# Patient Record
Sex: Male | Born: 1970 | Race: Black or African American | Hispanic: No | Marital: Married | Smoking: Never smoker
Health system: Southern US, Community
[De-identification: ages and names within clinical notes are randomized; demographics above are authoritative.]

## PROBLEM LIST (undated history)

## (undated) DIAGNOSIS — G4730 Sleep apnea, unspecified: Secondary | ICD-10-CM

## (undated) DIAGNOSIS — Z973 Presence of spectacles and contact lenses: Secondary | ICD-10-CM

## (undated) DIAGNOSIS — H9190 Unspecified hearing loss, unspecified ear: Secondary | ICD-10-CM

## (undated) HISTORY — DX: Sleep apnea, unspecified: G47.30

## (undated) HISTORY — DX: Unspecified hearing loss, unspecified ear: H91.90

## (undated) HISTORY — PX: SKIN GRAFT: SHX250

## (undated) HISTORY — PX: FOREARM FRACTURE SURGERY: SHX649

## (undated) HISTORY — PX: APPENDECTOMY: SHX54

---

## 2013-11-24 LAB — PSA

## 2014-11-28 ENCOUNTER — Encounter: Payer: Self-pay | Admitting: Family Medicine

## 2014-11-28 ENCOUNTER — Ambulatory Visit (INDEPENDENT_AMBULATORY_CARE_PROVIDER_SITE_OTHER): Payer: Managed Care, Other (non HMO) | Admitting: Family Medicine

## 2014-11-28 VITALS — BP 130/82 | HR 76 | Temp 98.4°F | Ht 66.4 in | Wt 194.0 lb

## 2014-11-28 DIAGNOSIS — B351 Tinea unguium: Secondary | ICD-10-CM | POA: Insufficient documentation

## 2014-11-28 DIAGNOSIS — Z0000 Encounter for general adult medical examination without abnormal findings: Secondary | ICD-10-CM | POA: Diagnosis not present

## 2014-11-28 LAB — UA/M W/RFLX CULTURE, ROUTINE
BILIRUBIN UA: NEGATIVE
GLUCOSE, UA: NEGATIVE
KETONES UA: NEGATIVE
LEUKOCYTES UA: NEGATIVE
Nitrite, UA: NEGATIVE
PROTEIN UA: NEGATIVE
SPEC GRAV UA: 1.02 (ref 1.005–1.030)
Urobilinogen, Ur: 0.2 mg/dL (ref 0.2–1.0)
pH, UA: 5.5 (ref 5.0–7.5)

## 2014-11-28 LAB — MICROSCOPIC EXAMINATION
Bacteria, UA: NONE SEEN
Epithelial Cells (non renal): NONE SEEN /hpf (ref 0–10)
RBC, UA: NONE SEEN /hpf (ref 0–?)
WBC, UA: NONE SEEN /hpf (ref 0–?)

## 2014-11-28 MED ORDER — CICLOPIROX 8 % EX SOLN: CUTANEOUS | Status: DC

## 2014-11-28 NOTE — Assessment & Plan Note (Signed)
Will treat with ciprodex. Recheck 3 months to see how he is doing. Not interested in taking a pill right now.

## 2014-11-28 NOTE — Progress Notes (Signed)
BP 130/82 mmHg  Pulse 76  Temp(Src) 98.4 F (36.9 C)  Ht 5' 6.4" (1.687 m)  Wt 194 lb (87.998 kg)  BMI 30.92 kg/m2  SpO2 97%   Subjective:    Patient ID: Manuel Ruffing., male    DOB: [birth year], 44 y.o.   MRN: [id number]  HPI: Manuel Dome. is a 44 y.o. male presenting on 11/28/2014 for comprehensive medical examination. Current medical complaints include: none  He currently lives with: his spouse Interim Problems from his last visit: toe nail fungus- very bothersome. Has tried OTC medications without benefit. Would like something done for it.   Depression Screen done today and results listed below:  Depression screen [facility] 2/9 11/28/2014  Decreased Interest 0  Down, Depressed, Hopeless 0  PHQ - 2 Score 0   Past Medical History:  Past Medical History  Diagnosis Date  . Hearing loss     Surgical History:  Past Surgical History  Procedure Laterality Date  . Forearm fracture surgery      x's 2  . Skin graft      ankle    Medications:  No current outpatient prescriptions on file prior to visit.   No current facility-administered medications on file prior to visit.    Allergies:  No Known Allergies  Social History:  Social History   Social History  . Marital Status: Married    Spouse Name: N/A  . Number of Children: N/A  . Years of Education: N/A   Occupational History  . Not on file.   Social History Main Topics  . Smoking status: Never Smoker   . Smokeless tobacco: Never Used  . Alcohol Use: 0.0 - 0.6 oz/week    0-1 Cans of beer per week     Comment: Socially.  . Drug Use: No  . Sexual Activity: Yes   Other Topics Concern  . Not on file   Social History Narrative   History  Smoking status  . Never Smoker   Smokeless tobacco  . Never Used   History  Alcohol Use  . 0.0 - 0.6 oz/week  . 0-1 Cans of beer per week    Comment: Socially.    Family History:  Family History  Problem Relation Age of Onset  . Diabetes Mother   . Heart  disease Father   . Hypertension Maternal Grandmother   . Alcohol abuse Maternal Grandfather   . Dementia Paternal Grandmother   . Cancer Paternal Grandfather     Lung    Past medical history, surgical history, medications, allergies, family history and social history reviewed with patient today and changes made to appropriate areas of the chart.   Review of Systems  Constitutional: Negative.   HENT: Negative.   Eyes: Negative.   Respiratory: Negative.   Cardiovascular: Negative.   Gastrointestinal: Positive for heartburn. Negative for nausea, vomiting, abdominal pain, diarrhea, constipation, blood in stool and melena.  Genitourinary: Negative.   Musculoskeletal: Negative.   Skin: Negative.   Neurological: Negative.   Endo/Heme/Allergies: Negative.   Psychiatric/Behavioral: Negative.     All other ROS negative except what is listed above and in the HPI.      Objective:    BP 130/82 mmHg  Pulse 76  Temp(Src) 98.4 F (36.9 C)  Ht 5' 6.4" (1.687 m)  Wt 194 lb (87.998 kg)  BMI 30.92 kg/m2  SpO2 97%  Wt Readings from Last 3 Encounters:  11/28/14 194 lb (87.998 kg)  11/24/13 197 lb (89.359 kg)  Physical Exam  Constitutional: He is oriented to person, place, and time. He appears well-developed and well-nourished. No distress.  HENT:  Head: Normocephalic and atraumatic.  Right Ear: Hearing and external ear normal.  Left Ear: Hearing and external ear normal.  Nose: Nose normal.  Mouth/Throat: Oropharynx is clear and moist. No oropharyngeal exudate.  Eyes: Conjunctivae, EOM and lids are normal. Pupils are equal, round, and reactive to light. Right eye exhibits no discharge. Left eye exhibits no discharge. No scleral icterus.  Neck: Normal range of motion. Neck supple. No JVD present. No tracheal deviation present. No thyromegaly present.  Cardiovascular: Normal rate, regular rhythm, normal heart sounds and intact distal pulses.  Exam reveals no gallop and no friction rub.    No murmur heard. Pulmonary/Chest: Effort normal and breath sounds normal. No stridor. No respiratory distress. He has no wheezes. He has no rales. He exhibits no tenderness.  Abdominal: Soft. Bowel sounds are normal. He exhibits no distension and no mass. There is no tenderness. There is no rebound and no guarding. Hernia confirmed negative in the right inguinal area and confirmed negative in the left inguinal area.  Genitourinary: Testes normal and penis normal. Cremasteric reflex is present. Uncircumcised. No phimosis, hypospadias, penile erythema or penile tenderness. No discharge found.  Musculoskeletal: Normal range of motion. He exhibits no edema or tenderness.  Lymphadenopathy:    He has no cervical adenopathy.       Right: No inguinal adenopathy present.       Left: No inguinal adenopathy present.  Neurological: He is alert and oriented to person, place, and time. He has normal reflexes. He displays normal reflexes. No cranial nerve deficit. He exhibits normal muscle tone. Coordination normal.  Skin: Skin is warm, dry and intact. No rash noted. No erythema. No pallor.  Discolored great toenails bilaterally  Psychiatric: He has a normal mood and affect. His speech is normal and behavior is normal. Judgment and thought content normal. Cognition and memory are normal.  Nursing note and vitals reviewed.   Results for orders placed or performed in visit on 11/27/14  PSA  Result Value Ref Range   PSA PP       Assessment & Plan:   Problem List Items Addressed This Visit      Musculoskeletal and Integument   Toenail fungus   Relevant Medications   ciclopirox (PENLAC) 8 % solution    Other Visit Diagnoses    Routine general medical examination at a health care facility    -  Primary    Doing well. Up to date on vaccines. Screening labs checked today. Work on diet and exercise. Continue to monitor.     Relevant Orders    CBC with Differential/Platelet    Comprehensive metabolic  panel    Lipid Panel w/o Chol/HDL Ratio    TSH    UA/M w/rflx Culture, Routine        LABORATORY TESTING:  Health maintenance labs ordered today as discussed above.   IMMUNIZATIONS:   - Tdap: Tetanus vaccination status reviewed: last tetanus booster within 10 years. - Influenza: Up to date - Pneumovax: Not applicable  PATIENT COUNSELING:    Sexuality: Discussed sexually transmitted diseases, partner selection, use of condoms, avoidance of unintended pregnancy  and contraceptive alternatives.   Advised to avoid cigarette smoking.  I discussed with the patient that most people either abstain from alcohol or drink within safe limits (<=14/week and <=4 drinks/occasion for males, <=7/weeks and <= 3 drinks/occasion for females)  and that the risk for alcohol disorders and other health effects rises proportionally with the number of drinks per week and how often a drinker exceeds daily limits.  Discussed cessation/primary prevention of drug use and availability of treatment for abuse.   Diet: Encouraged to adjust caloric intake to maintain  or achieve ideal body weight, to reduce intake of dietary saturated fat and total fat, to limit sodium intake by avoiding high sodium foods and not adding table salt, and to maintain adequate dietary potassium and calcium preferably from fresh fruits, vegetables, and low-fat dairy products.    stressed the importance of regular exercise  Injury prevention: Discussed safety belts, safety helmets, smoke detector, smoking near bedding or upholstery.   Dental health: Discussed importance of regular tooth brushing, flossing, and dental visits.   Follow up plan: NEXT PREVENTATIVE PHYSICAL DUE IN 1 YEAR. Return in about 3 months (around 02/28/2015) for toenail fungus follow up.

## 2014-11-28 NOTE — Patient Instructions (Addendum)
Health Maintenance, Male A healthy lifestyle and preventative care can promote health and wellness.  Maintain regular health, dental, and eye exams.  Eat a healthy diet. Foods like vegetables, fruits, whole grains, low-fat dairy products, and lean protein foods contain the nutrients you need and are low in calories. Decrease your intake of foods high in solid fats, added sugars, and salt. Get information about a proper diet from your health care provider, if necessary.  Regular physical exercise is one of the most important things you can do for your health. Most adults should get at least 150 minutes of moderate-intensity exercise (any activity that increases your heart rate and causes you to sweat) each week. In addition, most adults need muscle-strengthening exercises on 2 or more days a week.   Maintain a healthy weight. The body mass index (BMI) is a screening tool to identify possible weight problems. It provides an estimate of body fat based on height and weight. Your health care provider can find your BMI and can help you achieve or maintain a healthy weight. For males 20 years and older:  A BMI below 18.5 is considered underweight.  A BMI of 18.5 to 24.9 is normal.  A BMI of 25 to 29.9 is considered overweight.  A BMI of 30 and above is considered obese.  Maintain normal blood lipids and cholesterol by exercising and minimizing your intake of saturated fat. Eat a balanced diet with plenty of fruits and vegetables. Blood tests for lipids and cholesterol should begin at age 20 and be repeated every 5 years. If your lipid or cholesterol levels are high, you are over age 50, or you are at high risk for heart disease, you may need your cholesterol levels checked more frequently.Ongoing high lipid and cholesterol levels should be treated with medicines if diet and exercise are not working.  If you smoke, find out from your health care provider how to quit. If you do not use tobacco, do not  start.  Lung cancer screening is recommended for adults aged 55-80 years who are at high risk for developing lung cancer because of a history of smoking. A yearly low-dose CT scan of the lungs is recommended for people who have at least a 30-pack-year history of smoking and are current smokers or have quit within the past 15 years. A pack year of smoking is smoking an average of 1 pack of cigarettes a day for 1 year (for example, a 30-pack-year history of smoking could mean smoking 1 pack a day for 30 years or 2 packs a day for 15 years). Yearly screening should continue until the smoker has stopped smoking for at least 15 years. Yearly screening should be stopped for people who develop a health problem that would prevent them from having lung cancer treatment.  If you choose to drink alcohol, do not have more than 2 drinks per day. One drink is considered to be 12 oz (360 mL) of beer, 5 oz (150 mL) of wine, or 1.5 oz (45 mL) of liquor.  Avoid the use of street drugs. Do not share needles with anyone. Ask for help if you need support or instructions about stopping the use of drugs.  High blood pressure causes heart disease and increases the risk of stroke. High blood pressure is more likely to develop in:  People who have blood pressure in the end of the normal range (100-139/85-89 mm Hg).  People who are overweight or obese.  People who are African American.    If you are 18-39 years of age, have your blood pressure checked every 3-5 years. If you are 40 years of age or older, have your blood pressure checked every year. You should have your blood pressure measured twice--once when you are at a hospital or clinic, and once when you are not at a hospital or clinic. Record the average of the two measurements. To check your blood pressure when you are not at a hospital or clinic, you can use:  An automated blood pressure machine at a pharmacy.  A home blood pressure monitor.  If you are 45-79 years  old, ask your health care provider if you should take aspirin to prevent heart disease.  Diabetes screening involves taking a blood sample to check your fasting blood sugar level. This should be done once every 3 years after age 45 if you are at a normal weight and without risk factors for diabetes. Testing should be considered at a younger age or be carried out more frequently if you are overweight and have at least 1 risk factor for diabetes.  Colorectal cancer can be detected and often prevented. Most routine colorectal cancer screening begins at the age of 50 and continues through age 75. However, your health care provider may recommend screening at an earlier age if you have risk factors for colon cancer. On a yearly basis, your health care provider may provide home test kits to check for hidden blood in the stool. A small camera at the end of a tube may be used to directly examine the colon (sigmoidoscopy or colonoscopy) to detect the earliest forms of colorectal cancer. Talk to your health care provider about this at age 50 when routine screening begins. A direct exam of the colon should be repeated every 5-10 years through age 75, unless early forms of precancerous polyps or small growths are found.  People who are at an increased risk for hepatitis B should be screened for this virus. You are considered at high risk for hepatitis B if:  You were born in a country where hepatitis B occurs often. Talk with your health care provider about which countries are considered high risk.  Your parents were born in a high-risk country and you have not received a shot to protect against hepatitis B (hepatitis B vaccine).  You have HIV or AIDS.  You use needles to inject street drugs.  You live with, or have sex with, someone who has hepatitis B.  You are a man who has sex with other men (MSM).  You get hemodialysis treatment.  You take certain medicines for conditions like cancer, organ  transplantation, and autoimmune conditions.  Hepatitis C blood testing is recommended for all people born from 1945 through 1965 and any individual with known risk factors for hepatitis C.  Healthy men should no longer receive prostate-specific antigen (PSA) blood tests as part of routine cancer screening. Talk to your health care provider about prostate cancer screening.  Testicular cancer screening is not recommended for adolescents or adult males who have no symptoms. Screening includes self-exam, a health care provider exam, and other screening tests. Consult with your health care provider about any symptoms you have or any concerns you have about testicular cancer.  Practice safe sex. Use condoms and avoid high-risk sexual practices to reduce the spread of sexually transmitted infections (STIs).  You should be screened for STIs, including gonorrhea and chlamydia if:  You are sexually active and are younger than 24 years.  You   are older than 24 years, and your health care provider tells you that you are at risk for this type of infection.  Your sexual activity has changed since you were last screened, and you are at an increased risk for chlamydia or gonorrhea. Ask your health care provider if you are at risk.  If you are at risk of being infected with HIV, it is recommended that you take a prescription medicine daily to prevent HIV infection. This is called pre-exposure prophylaxis (PrEP). You are considered at risk if:  You are a man who has sex with other men (MSM).  You are a heterosexual man who is sexually active with multiple partners.  You take drugs by injection.  You are sexually active with a partner who has HIV.  Talk with your health care provider about whether you are at high risk of being infected with HIV. If you choose to begin PrEP, you should first be tested for HIV. You should then be tested every 3 months for as long as you are taking PrEP.  Use sunscreen. Apply  sunscreen liberally and repeatedly throughout the day. You should seek shade when your shadow is shorter than you. Protect yourself by wearing long sleeves, pants, a wide-brimmed hat, and sunglasses year round whenever you are outdoors.  Tell your health care provider of new moles or changes in moles, especially if there is a change in shape or color. Also, tell your health care provider if a mole is larger than the size of a pencil eraser.  A one-time screening for abdominal aortic aneurysm (AAA) and surgical repair of large AAAs by ultrasound is recommended for men aged 6-75 years who are current or former smokers.  Stay current with your vaccines (immunizations).   This information is not intended to replace advice given to you by your health care provider. Make sure you discuss any questions you have with your health care provider.   Document Released: 07/19/2007 Document Revised: 02/10/2014 Document Reviewed: 06/17/2010 Elsevier Interactive Patient Education 2016 [employer] Ciclopirox nail solution What is this medicine? CICLOPIROX (sye kloe PEER ox) NAIL SOLUTION is an antifungal medicine. It used to treat fungal infections of the nails. This medicine may be used for other purposes; ask your health care provider or pharmacist if you have questions. What should I tell my health care provider before I take this medicine? They need to know if you have any of these conditions: -diabetes mellitus -history of seizures -HIV infection -immune system problems or organ transplant -large areas of burned or damaged skin -peripheral vascular disease or poor circulation -taking corticosteroid medication (including steroid inhalers, cream, or lotion) -an unusual or allergic reaction to ciclopirox, isopropyl alcohol, other medicines, foods, dyes, or preservatives -pregnant or trying to get pregnant -breast-feeding How should I use this medicine? This medicine is for external use only. Follow  the directions that come with this medicine exactly. Wash and dry your hands before use. Avoid contact with the eyes, mouth or nose. If you do get this medicine in your eyes, rinse out with plenty of cool tap water. Contact your doctor or health care professional if eye irritation occurs. Use at regular intervals. Do not use your medicine more often than directed. Finish the full course prescribed by your doctor or health care professional even if you think you are better. Do not stop using except on your doctor's advice. Talk to your pediatrician regarding the use of this medicine in children. While this medicine may be  prescribed for children as young as 12 years for selected conditions, precautions do apply. Overdosage: If you think you have taken too much of this medicine contact a poison control center or emergency room at once. NOTE: This medicine is only for you. Do not share this medicine with others. What if I miss a dose? If you miss a dose, use it as soon as you can. If it is almost time for your next dose, use only that dose. Do not use double or extra doses. What may interact with this medicine? Interactions are not expected. Do not use any other skin products without telling your doctor or health care professional. This list may not describe all possible interactions. Give your health care provider a list of all the medicines, herbs, non-prescription drugs, or dietary supplements you use. Also tell them if you smoke, drink alcohol, or use illegal drugs. Some items may interact with your medicine. What should I watch for while using this medicine? Tell your doctor or health care professional if your symptoms get worse. Four to six months of treatment may be needed for the nail(s) to improve. Some people may not achieve a complete cure or clearing of the nails by this time. Tell your doctor or health care professional if you develop sores or blisters that do not heal properly. If your nail  infection returns after stopping using this product, contact your doctor or health care professional. What side effects may I notice from receiving this medicine? Side effects that you should report to your doctor or health care professional as soon as possible: -allergic reactions like skin rash, itching or hives, swelling of the face, lips, or tongue -severe irritation, redness, burning, blistering, peeling, swelling, oozing Side effects that usually do not require medical attention (report to your doctor or health care professional if they continue or are bothersome): -mild reddening of the skin -nail discoloration -temporary burning or mild stinging at the site of application This list may not describe all possible side effects. Call your doctor for medical advice about side effects. You may report side effects to FDA at 1-800-FDA-1088. Where should I keep my medicine? Keep out of the reach of children. Store at room temperature between 15 and 30 degrees C (59 and 86 degrees F). Do not freeze. Protect from light by storing the bottle in the carton after every use. This medicine is flammable. Keep away from heat and flame. Throw away any unused medicine after the expiration date. NOTE: This sheet is a summary. It may not cover all possible information. If you have questions about this medicine, talk to your doctor, pharmacist, or health care provider.    2016, Elsevier/Gold Standard. (2007-04-26 16:49:20)

## 2014-11-29 ENCOUNTER — Encounter: Payer: Self-pay | Admitting: Family Medicine

## 2014-11-29 LAB — COMPREHENSIVE METABOLIC PANEL
ALBUMIN: 4.7 g/dL (ref 3.5–5.5)
ALT: 22 IU/L (ref 0–44)
AST: 19 IU/L (ref 0–40)
Albumin/Globulin Ratio: 2 (ref 1.1–2.5)
Alkaline Phosphatase: 56 IU/L (ref 39–117)
BILIRUBIN TOTAL: 1 mg/dL (ref 0.0–1.2)
BUN / CREAT RATIO: 15 (ref 9–20)
BUN: 17 mg/dL (ref 6–24)
CO2: 23 mmol/L (ref 18–29)
CREATININE: 1.12 mg/dL (ref 0.76–1.27)
Calcium: 9.7 mg/dL (ref 8.7–10.2)
Chloride: 103 mmol/L (ref 97–106)
GFR calc non Af Amer: 79 mL/min/{1.73_m2} (ref >59)
GFR, EST AFRICAN AMERICAN: 91 mL/min/{1.73_m2} (ref >59)
GLUCOSE: 100 mg/dL — AB (ref 65–99)
Globulin, Total: 2.4 g/dL (ref 1.5–4.5)
Potassium: 4.6 mmol/L (ref 3.5–5.2)
Sodium: 142 mmol/L (ref 136–144)
TOTAL PROTEIN: 7.1 g/dL (ref 6.0–8.5)

## 2014-11-29 LAB — CBC WITH DIFFERENTIAL/PLATELET
BASOS ABS: 0 10*3/uL (ref 0.0–0.2)
Basos: 0 %
EOS (ABSOLUTE): 0.1 10*3/uL (ref 0.0–0.4)
EOS: 2 %
HEMATOCRIT: 43.5 % (ref 37.5–51.0)
HEMOGLOBIN: 14.9 g/dL (ref 12.6–17.7)
IMMATURE GRANS (ABS): 0 10*3/uL (ref 0.0–0.1)
Immature Granulocytes: 0 %
LYMPHS: 29 %
Lymphocytes Absolute: 1.7 10*3/uL (ref 0.7–3.1)
MCH: 31.3 pg (ref 26.6–33.0)
MCHC: 34.3 g/dL (ref 31.5–35.7)
MCV: 91 fL (ref 79–97)
MONOCYTES: 8 %
Monocytes Absolute: 0.5 10*3/uL (ref 0.1–0.9)
NEUTROS ABS: 3.6 10*3/uL (ref 1.4–7.0)
Neutrophils: 61 %
Platelets: 214 10*3/uL (ref 150–379)
RBC: 4.76 x10E6/uL (ref 4.14–5.80)
RDW: 12.3 % (ref 12.3–15.4)
WBC: 5.9 10*3/uL (ref 3.4–10.8)

## 2014-11-29 LAB — LIPID PANEL W/O CHOL/HDL RATIO
Cholesterol, Total: 174 mg/dL (ref 100–199)
HDL: 28 mg/dL — ABNORMAL LOW (ref >39)
LDL CALC: 124 mg/dL — AB (ref 0–99)
Triglycerides: 109 mg/dL (ref 0–149)
VLDL CHOLESTEROL CAL: 22 mg/dL (ref 5–40)

## 2014-11-29 LAB — TSH: TSH: 1.74 u[IU]/mL (ref 0.450–4.500)

## 2017-04-24 ENCOUNTER — Encounter: Payer: Self-pay | Admitting: Family Medicine

## 2017-04-24 ENCOUNTER — Ambulatory Visit (INDEPENDENT_AMBULATORY_CARE_PROVIDER_SITE_OTHER): Payer: 59 | Admitting: Family Medicine

## 2017-04-24 VITALS — BP 136/88 | HR 67 | Temp 98.4°F | Ht 66.3 in | Wt 201.3 lb

## 2017-04-24 DIAGNOSIS — Z0001 Encounter for general adult medical examination with abnormal findings: Secondary | ICD-10-CM

## 2017-04-24 DIAGNOSIS — R030 Elevated blood-pressure reading, without diagnosis of hypertension: Secondary | ICD-10-CM | POA: Diagnosis not present

## 2017-04-24 DIAGNOSIS — Z1211 Encounter for screening for malignant neoplasm of colon: Secondary | ICD-10-CM

## 2017-04-24 DIAGNOSIS — B360 Pityriasis versicolor: Secondary | ICD-10-CM | POA: Diagnosis not present

## 2017-04-24 DIAGNOSIS — R0683 Snoring: Secondary | ICD-10-CM | POA: Diagnosis not present

## 2017-04-24 DIAGNOSIS — B351 Tinea unguium: Secondary | ICD-10-CM

## 2017-04-24 DIAGNOSIS — Z0000 Encounter for general adult medical examination without abnormal findings: Secondary | ICD-10-CM

## 2017-04-24 LAB — MICROALBUMIN, URINE WAIVED
CREATININE, URINE WAIVED: 300 mg/dL (ref 10–300)
Microalb, Ur Waived: 10 mg/L (ref 0–19)
Microalb/Creat Ratio: <30 mg/g (ref <30)

## 2017-04-24 LAB — UA/M W/RFLX CULTURE, ROUTINE
BILIRUBIN UA: NEGATIVE
Glucose, UA: NEGATIVE
KETONES UA: NEGATIVE
Leukocytes, UA: NEGATIVE
Nitrite, UA: NEGATIVE
Protein, UA: NEGATIVE
RBC UA: NEGATIVE
SPEC GRAV UA: 1.02 (ref 1.005–1.030)
UUROB: 0.2 mg/dL (ref 0.2–1.0)
pH, UA: 5 (ref 5.0–7.5)

## 2017-04-24 MED ORDER — TERBINAFINE HCL 1 % EX CREA: 1.0000 "application " | TOPICAL_CREAM | Freq: Two times a day (BID) | CUTANEOUS | 0 refills | Status: DC

## 2017-04-24 MED ORDER — CICLOPIROX 8 % EX SOLN: CUTANEOUS | 3 refills | Status: DC

## 2017-04-24 NOTE — Progress Notes (Signed)
BP 136/88 (BP Location: Left Arm, Cuff Size: Normal)   Pulse 67   Temp 98.4 F (36.9 C)   Ht 5' 6.3" (1.684 m)   Wt 201 lb 5 oz (91.3 kg)   SpO2 99%   BMI 32.20 kg/m    Subjective:    Patient ID: Manuel Ruffing., male    DOB: [birth year], 47 y.o.   MRN: [id number]  HPI: Manuel Mankey. is a 47 y.o. male presenting on 04/24/2017 for comprehensive medical examination. Current medical complaints include:  ??SLEEP APNEA Sleep apnea status: unknown- has been snoring  Duration: Few years Last sleep study: Never Treatments attempted: none Wakes feeling refreshed:  no Daytime hypersomnolence:  no Fatigue:  yes Insomnia:  no Good sleep hygiene:  no Difficulty falling asleep:  no Difficulty staying asleep:  no Snoring bothers bed partner:  yes Observed apnea by bed partner: no Obesity:  yes Hypertension: yes  Pulmonary hypertension:  no Coronary artery disease:  no  Continues with tinea versicolor on his neck. Usually better with lamisil, but hasn't been using. Toenails thick and discolored again. Did well with penlac- but back when he stopped using it.   He currently lives with: wife and kids Interim Problems from his last visit: no  Depression Screen done today and results listed below:  Depression screen [facility] 2/9 04/24/2017 11/28/2014  Decreased Interest 0 0  Down, Depressed, Hopeless 0 0  PHQ - 2 Score 0 0  Altered sleeping 0 -  Tired, decreased energy 0 -  Change in appetite 0 -  Feeling bad or failure about yourself  0 -  Trouble concentrating 0 -  Moving slowly or fidgety/restless 0 -  Suicidal thoughts 0 -  PHQ-9 Score 0 -    Past Medical History:  Past Medical History:  Diagnosis Date  . Hearing loss     Surgical History:  Past Surgical History:  Procedure Laterality Date  . FOREARM FRACTURE SURGERY     x's 2  . SKIN GRAFT     ankle    Medications:  No current outpatient medications on file prior to visit.   No current facility-administered  medications on file prior to visit.     Allergies:  No Known Allergies  Social History:  Social History   Socioeconomic History  . Marital status: Married    Spouse name: Not on file  . Number of children: Not on file  . Years of education: Not on file  . Highest education level: Not on file  Occupational History  . Not on file  Social Needs  . Financial resource strain: Not on file  . Food insecurity:    Worry: Not on file    Inability: Not on file  . Transportation needs:    Medical: Not on file    Non-medical: Not on file  Tobacco Use  . Smoking status: Never Smoker  . Smokeless tobacco: Never Used  Substance and Sexual Activity  . Alcohol use: Yes    Alcohol/week: 0.0 - 0.6 oz    Comment: Socially.  . Drug use: No  . Sexual activity: Yes  Lifestyle  . Physical activity:    Days per week: Not on file    Minutes per session: Not on file  . Stress: Not on file  Relationships  . Social connections:    Talks on phone: Not on file    Gets together: Not on file    Attends religious service: Not on file  Active member of club or organization: Not on file    Attends meetings of clubs or organizations: Not on file    Relationship status: Not on file  . Intimate partner violence:    Fear of current or ex partner: Not on file    Emotionally abused: Not on file    Physically abused: Not on file    Forced sexual activity: Not on file  Other Topics Concern  . Not on file  Social History Narrative  . Not on file   Social History   Tobacco Use  Smoking Status Never Smoker  Smokeless Tobacco Never Used   Social History   Substance and Sexual Activity  Alcohol Use Yes  . Alcohol/week: 0.0 - 0.6 oz   Comment: Socially.    Family History:  Family History  Problem Relation Age of Onset  . Diabetes Mother   . Heart disease Father   . Hypertension Maternal Grandmother   . Alcohol abuse Maternal Grandfather   . Dementia Paternal Grandmother   . Cancer  Paternal Grandfather        Lung    Past medical history, surgical history, medications, allergies, family history and social history reviewed with patient today and changes made to appropriate areas of the chart.   Review of Systems  Constitutional: Negative.   HENT: Positive for hearing loss (L ear, chronic). Negative for congestion, ear discharge, ear pain, nosebleeds, sinus pain, sore throat and tinnitus.   Eyes: Negative.   Respiratory: Negative.  Negative for stridor.   Cardiovascular: Negative.   Gastrointestinal: Positive for heartburn. Negative for abdominal pain, blood in stool, constipation, diarrhea, melena, nausea and vomiting.  Genitourinary: Negative.   Musculoskeletal: Negative.   Skin: Positive for rash. Negative for itching.  Neurological: Negative.   Endo/Heme/Allergies: Negative.   Psychiatric/Behavioral: Negative.     All other ROS negative except what is listed above and in the HPI.      Objective:    BP 136/88 (BP Location: Left Arm, Cuff Size: Normal)   Pulse 67   Temp 98.4 F (36.9 C)   Ht 5' 6.3" (1.684 m)   Wt 201 lb 5 oz (91.3 kg)   SpO2 99%   BMI 32.20 kg/m   Wt Readings from Last 3 Encounters:  04/24/17 201 lb 5 oz (91.3 kg)  11/28/14 194 lb (88 kg)  11/24/13 197 lb (89.4 kg)    Physical Exam  Constitutional: He is oriented to person, place, and time. He appears well-developed and well-nourished. No distress.  HENT:  Head: Normocephalic and atraumatic.  Right Ear: Hearing, tympanic membrane, external ear and ear canal normal.  Left Ear: Hearing, tympanic membrane, external ear and ear canal normal.  Nose: Nose normal.  Mouth/Throat: Uvula is midline, oropharynx is clear and moist and mucous membranes are normal. No oropharyngeal exudate.  Eyes: Pupils are equal, round, and reactive to light. Conjunctivae, EOM and lids are normal. Right eye exhibits no discharge. Left eye exhibits no discharge. No scleral icterus.  Neck: Normal range of  motion. Neck supple. No JVD present. No tracheal deviation present. No thyromegaly present.  Cardiovascular: Normal rate, regular rhythm, normal heart sounds and intact distal pulses. Exam reveals no gallop and no friction rub.  No murmur heard. Pulmonary/Chest: Effort normal and breath sounds normal. No stridor. No respiratory distress. He has no wheezes. He has no rales. He exhibits no tenderness.  Abdominal: Soft. Bowel sounds are normal. He exhibits no distension and no mass. There is no  tenderness. There is no rebound and no guarding. Hernia confirmed negative in the right inguinal area and confirmed negative in the left inguinal area.  Genitourinary: Testes normal and penis normal. Cremasteric reflex is present. Right testis shows no mass, no swelling and no tenderness. Right testis is descended. Cremasteric reflex is not absent on the right side. Left testis shows no mass, no swelling and no tenderness. Left testis is descended. Cremasteric reflex is not absent on the left side. Uncircumcised. No phimosis, paraphimosis, hypospadias, penile erythema or penile tenderness. No discharge found.  Musculoskeletal: Normal range of motion. He exhibits no edema, tenderness or deformity.  Lymphadenopathy:    He has no cervical adenopathy.  Neurological: He is alert and oriented to person, place, and time. He has normal reflexes. He displays normal reflexes. No cranial nerve deficit. He exhibits normal muscle tone. Coordination normal.  Skin: Skin is warm, dry and intact. Rash noted. He is not diaphoretic. No erythema. No pallor.  Hyperpigmented patch on R side of his neck, thick, discolored nails on toenails  Psychiatric: He has a normal mood and affect. His speech is normal and behavior is normal. Judgment and thought content normal. Cognition and memory are normal.  Nursing note and vitals reviewed.   Results for orders placed or performed in visit on 11/28/14  Microscopic Examination  Result Value  Ref Range   WBC, UA None seen 0 - 5 /hpf   RBC, UA None seen 0 - 2 /hpf   Epithelial Cells (non renal) None seen 0 - 10 /hpf   Bacteria, UA None seen None seen/Few  CBC with Differential/Platelet  Result Value Ref Range   WBC 5.9 3.4 - 10.8 x10E3/uL   RBC 4.76 4.14 - 5.80 x10E6/uL   Hemoglobin 14.9 12.6 - 17.7 g/dL   Hematocrit 43.5 37.5 - 51.0 %   MCV 91 79 - 97 fL   MCH 31.3 26.6 - 33.0 pg   MCHC 34.3 31.5 - 35.7 g/dL   RDW 12.3 12.3 - 15.4 %   Platelets 214 150 - 379 x10E3/uL   Neutrophils 61 %   Lymphs 29 %   Monocytes 8 %   Eos 2 %   Basos 0 %   Neutrophils Absolute 3.6 1.4 - 7.0 x10E3/uL   Lymphocytes Absolute 1.7 0.7 - 3.1 x10E3/uL   Monocytes Absolute 0.5 0.1 - 0.9 x10E3/uL   EOS (ABSOLUTE) 0.1 0.0 - 0.4 x10E3/uL   Basophils Absolute 0.0 0.0 - 0.2 x10E3/uL   Immature Granulocytes 0 %   Immature Grans (Abs) 0.0 0.0 - 0.1 x10E3/uL  Comprehensive metabolic panel  Result Value Ref Range   Glucose 100 (H) 65 - 99 mg/dL   BUN 17 6 - 24 mg/dL   Creatinine, Ser 1.12 0.76 - 1.27 mg/dL   GFR calc non Af Amer 79 >59 mL/min/1.73   GFR calc Af Amer 91 >59 mL/min/1.73   BUN/Creatinine Ratio 15 9 - 20   Sodium 142 136 - 144 mmol/L   Potassium 4.6 3.5 - 5.2 mmol/L   Chloride 103 97 - 106 mmol/L   CO2 23 18 - 29 mmol/L   Calcium 9.7 8.7 - 10.2 mg/dL   Total Protein 7.1 6.0 - 8.5 g/dL   Albumin 4.7 3.5 - 5.5 g/dL   Globulin, Total 2.4 1.5 - 4.5 g/dL   Albumin/Globulin Ratio 2.0 1.1 - 2.5   Bilirubin Total 1.0 0.0 - 1.2 mg/dL   Alkaline Phosphatase 56 39 - 117 IU/L   AST 19 0 -  40 IU/L   ALT 22 0 - 44 IU/L  Lipid Panel w/o Chol/HDL Ratio  Result Value Ref Range   Cholesterol, Total 174 100 - 199 mg/dL   Triglycerides 109 0 - 149 mg/dL   HDL 28 (L) >39 mg/dL   VLDL Cholesterol Cal 22 5 - 40 mg/dL   LDL Calculated 124 (H) 0 - 99 mg/dL  TSH  Result Value Ref Range   TSH 1.740 0.450 - 4.500 uIU/mL  UA/M w/rflx Culture, Routine  Result Value Ref Range   Specific Gravity,  UA 1.020 1.005 - 1.030   pH, UA 5.5 5.0 - 7.5   Color, UA Yellow Yellow   Appearance Ur Clear Clear   Leukocytes, UA Negative Negative   Protein, UA Negative Negative/Trace   Glucose, UA Negative Negative   Ketones, UA Negative Negative   RBC, UA Trace (A) Negative   Bilirubin, UA Negative Negative   Urobilinogen, Ur 0.2 0.2 - 1.0 mg/dL   Nitrite, UA Negative Negative   Microscopic Examination See below:       Assessment & Plan:   Problem List Items Addressed This Visit      Musculoskeletal and Integument   Toenail fungus    Will treat with penlac. Call with any concerns.       Relevant Medications   terbinafine (LAMISIL) 1 % cream   ciclopirox (PENLAC) 8 % solution   Tinea versicolor    Will treat with lamisil. Call with any concerns.        Other Visit Diagnoses    Routine general medical examination at a health care facility    -  Primary   Vaccines up to date. Screening labs checked today. Colonoscopy ordered. Continue diet and exercise. Call with any concerns.    Relevant Orders   CBC with Differential/Platelet   Comprehensive metabolic panel   Lipid Panel w/o Chol/HDL Ratio   TSH   UA/M w/rflx Culture, Routine   Snoring       Will get him in for sleep study. Call with any concerns.    Relevant Orders   Ambulatory referral to Sleep Studies   Screening for colon cancer       Due at 10 for black males, unsure if insurance will cover until 68- referral placed today.   Relevant Orders   Ambulatory referral to Gastroenterology   Elevated BP without diagnosis of hypertension       Better on recheck. Under stress today. Checking labs and microalbumin. Work on [employer]. Call with any concerns.    Relevant Orders   Microalbumin, Urine Waived       Discussed aspirin prophylaxis for myocardial infarction prevention and decision was it was not indicated  LABORATORY TESTING:  Health maintenance labs ordered today as discussed above.   IMMUNIZATIONS:   - Tdap:  Tetanus vaccination status reviewed: last tetanus booster within 10 years. - Influenza: Refused - Pneumovax: Not applicable  PATIENT COUNSELING:    Sexuality: Discussed sexually transmitted diseases, partner selection, use of condoms, avoidance of unintended pregnancy  and contraceptive alternatives.   Advised to avoid cigarette smoking.  I discussed with the patient that most people either abstain from alcohol or drink within safe limits (<=14/week and <=4 drinks/occasion for males, <=7/weeks and <= 3 drinks/occasion for females) and that the risk for alcohol disorders and other health effects rises proportionally with the number of drinks per week and how often a drinker exceeds daily limits.  Discussed cessation/primary prevention of drug use  and availability of treatment for abuse.   Diet: Encouraged to adjust caloric intake to maintain  or achieve ideal body weight, to reduce intake of dietary saturated fat and total fat, to limit sodium intake by avoiding high sodium foods and not adding table salt, and to maintain adequate dietary potassium and calcium preferably from fresh fruits, vegetables, and low-fat dairy products.    stressed the importance of regular exercise  Injury prevention: Discussed safety belts, safety helmets, smoke detector, smoking near bedding or upholstery.   Dental health: Discussed importance of regular tooth brushing, flossing, and dental visits.   Follow up plan: NEXT PREVENTATIVE PHYSICAL DUE IN 1 YEAR. Return in about 1 year (around 04/25/2018) for Physical.

## 2017-04-24 NOTE — Assessment & Plan Note (Signed)
Will treat with lamisil. Call with any concerns.

## 2017-04-24 NOTE — Assessment & Plan Note (Signed)
Will treat with penlac. Call with any concerns.

## 2017-04-24 NOTE — Patient Instructions (Signed)
DASH Eating Plan DASH stands for "Dietary Approaches to Stop Hypertension." The DASH eating plan is a healthy eating plan that has been shown to reduce high blood pressure (hypertension). It may also reduce your risk for type 2 diabetes, heart disease, and stroke. The DASH eating plan may also help with weight loss. What are tips for following this plan? General guidelines  Avoid eating more than 2,300 mg (milligrams) of salt (sodium) a day. If you have hypertension, you may need to reduce your sodium intake to 1,500 mg a day.  Limit alcohol intake to no more than 1 drink a day for nonpregnant women and 2 drinks a day for men. One drink equals 12 oz of beer, 5 oz of wine, or 1 oz of hard liquor.  Work with your health care provider to maintain a healthy body weight or to lose weight. Ask what an ideal weight is for you.  Get at least 30 minutes of exercise that causes your heart to beat faster (aerobic exercise) most days of the week. Activities may include walking, swimming, or biking.  Work with your health care provider or diet and nutrition specialist (dietitian) to adjust your eating plan to your individual calorie needs. Reading food labels  Check food labels for the amount of sodium per serving. Choose foods with less than 5 percent of the Daily Value of sodium. Generally, foods with less than 300 mg of sodium per serving fit into this eating plan.  To find whole grains, look for the word "whole" as the first word in the ingredient list. Shopping  Buy products labeled as "low-sodium" or "no salt added."  Buy fresh foods. Avoid canned foods and premade or frozen meals. Cooking  Avoid adding salt when cooking. Use salt-free seasonings or herbs instead of table salt or sea salt. Check with your health care provider or pharmacist before using salt substitutes.  Do not fry foods. Cook foods using healthy methods such as baking, boiling, grilling, and broiling instead.  Cook with  heart-healthy oils, such as olive, canola, soybean, or sunflower oil. Meal planning   Eat a balanced diet that includes: ? 5 or more servings of fruits and vegetables each day. At each meal, try to fill half of your plate with fruits and vegetables. ? Up to 6-8 servings of whole grains each day. ? Less than 6 oz of lean meat, poultry, or fish each day. A 3-oz serving of meat is about the same size as a deck of cards. One egg equals 1 oz. ? 2 servings of low-fat dairy each day. ? A serving of nuts, seeds, or beans 5 times each week. ? Heart-healthy fats. Healthy fats called Omega-3 fatty acids are found in foods such as flaxseeds and coldwater fish, like sardines, salmon, and mackerel.  Limit how much you eat of the following: ? Canned or prepackaged foods. ? Food that is high in trans fat, such as fried foods. ? Food that is high in saturated fat, such as fatty meat. ? Sweets, desserts, sugary drinks, and other foods with added sugar. ? Full-fat dairy products.  Do not salt foods before eating.  Try to eat at least 2 vegetarian meals each week.  Eat more home-cooked food and less restaurant, buffet, and fast food.  When eating at a restaurant, ask that your food be prepared with less salt or no salt, if possible. What foods are recommended? The items listed may not be a complete list. Talk with your dietitian about what   dietary choices are best for you. Grains Whole-grain or whole-wheat bread. Whole-grain or whole-wheat pasta. Brown rice. Oatmeal. Quinoa. Bulgur. Whole-grain and low-sodium cereals. Pita bread. Low-fat, low-sodium crackers. Whole-wheat flour tortillas. Vegetables Fresh or frozen vegetables (raw, steamed, roasted, or grilled). Low-sodium or reduced-sodium tomato and vegetable juice. Low-sodium or reduced-sodium tomato sauce and tomato paste. Low-sodium or reduced-sodium canned vegetables. Fruits All fresh, dried, or frozen fruit. Canned fruit in natural juice (without  added sugar). Meat and other protein foods Skinless chicken or turkey. Ground chicken or turkey. Pork with fat trimmed off. Fish and seafood. Egg whites. Dried beans, peas, or lentils. Unsalted nuts, nut butters, and seeds. Unsalted canned beans. Lean cuts of beef with fat trimmed off. Low-sodium, lean deli meat. Dairy Low-fat (1%) or fat-free (skim) milk. Fat-free, low-fat, or reduced-fat cheeses. Nonfat, low-sodium ricotta or cottage cheese. Low-fat or nonfat yogurt. Low-fat, low-sodium cheese. Fats and oils Soft margarine without trans fats. Vegetable oil. Low-fat, reduced-fat, or light mayonnaise and salad dressings (reduced-sodium). Canola, safflower, olive, soybean, and sunflower oils. Avocado. Seasoning and other foods Herbs. Spices. Seasoning mixes without salt. Unsalted popcorn and pretzels. Fat-free sweets. What foods are not recommended? The items listed may not be a complete list. Talk with your dietitian about what dietary choices are best for you. Grains Baked goods made with fat, such as croissants, muffins, or some breads. Dry pasta or rice meal packs. Vegetables Creamed or fried vegetables. Vegetables in a cheese sauce. Regular canned vegetables (not low-sodium or reduced-sodium). Regular canned tomato sauce and paste (not low-sodium or reduced-sodium). Regular tomato and vegetable juice (not low-sodium or reduced-sodium). Pickles. Olives. Fruits Canned fruit in a light or heavy syrup. Fried fruit. Fruit in cream or butter sauce. Meat and other protein foods Fatty cuts of meat. Ribs. Fried meat. Bacon. Sausage. Bologna and other processed lunch meats. Salami. Fatback. Hotdogs. Bratwurst. Salted nuts and seeds. Canned beans with added salt. Canned or smoked fish. Whole eggs or egg yolks. Chicken or turkey with skin. Dairy Whole or 2% milk, cream, and half-and-half. Whole or full-fat cream cheese. Whole-fat or sweetened yogurt. Full-fat cheese. Nondairy creamers. Whipped toppings.  Processed cheese and cheese spreads. Fats and oils Butter. Stick margarine. Lard. Shortening. Ghee. Bacon fat. Tropical oils, such as coconut, palm kernel, or palm oil. Seasoning and other foods Salted popcorn and pretzels. Onion salt, garlic salt, seasoned salt, table salt, and sea salt. Worcestershire sauce. Tartar sauce. Barbecue sauce. Teriyaki sauce. Soy sauce, including reduced-sodium. Steak sauce. Canned and packaged gravies. Fish sauce. Oyster sauce. Cocktail sauce. Horseradish that you find on the shelf. Ketchup. Mustard. Meat flavorings and tenderizers. Bouillon cubes. Hot sauce and Tabasco sauce. Premade or packaged marinades. Premade or packaged taco seasonings. Relishes. Regular salad dressings. Where to find more information:  National Heart, Lung, and Blood Institute: www.nhlbi.nih.gov  American Heart Association: www.heart.org Summary  The DASH eating plan is a healthy eating plan that has been shown to reduce high blood pressure (hypertension). It may also reduce your risk for type 2 diabetes, heart disease, and stroke.  With the DASH eating plan, you should limit salt (sodium) intake to 2,300 mg a day. If you have hypertension, you may need to reduce your sodium intake to 1,500 mg a day.  When on the DASH eating plan, aim to eat more fresh fruits and vegetables, whole grains, lean proteins, low-fat dairy, and heart-healthy fats.  Work with your health care provider or diet and nutrition specialist (dietitian) to adjust your eating plan to your individual   calorie needs. This information is not intended to replace advice given to you by your health care provider. Make sure you discuss any questions you have with your health care provider. Document Released: 01/09/2011 Document Revised: 01/14/2016 Document Reviewed: 01/14/2016 Elsevier Interactive Patient Education  2018 Elsevier Inc.  Health Maintenance, Male A healthy lifestyle and preventive care is important for your  health and wellness. Ask your health care provider about what schedule of regular examinations is right for you. What should I know about weight and diet? Eat a Healthy Diet  Eat plenty of vegetables, fruits, whole grains, low-fat dairy products, and lean protein.  Do not eat a lot of foods high in solid fats, added sugars, or salt.  Maintain a Healthy Weight Regular exercise can help you achieve or maintain a healthy weight. You should:  Do at least 150 minutes of exercise each week. The exercise should increase your heart rate and make you sweat (moderate-intensity exercise).  Do strength-training exercises at least twice a week.  Watch Your Levels of Cholesterol and Blood Lipids  Have your blood tested for lipids and cholesterol every 5 years starting at 49 years of age. If you are at high risk for heart disease, you should start having your blood tested when you are 49 years old. You may need to have your cholesterol levels checked more often if: ? Your lipid or cholesterol levels are high. ? You are older than 50 years of age. ? You are at high risk for heart disease.  What should I know about cancer screening? Many types of cancers can be detected early and may often be prevented. Lung Cancer  You should be screened every year for lung cancer if: ? You are a current smoker who has smoked for at least 30 years. ? You are a former smoker who has quit within the past 15 years.  Talk to your health care provider about your screening options, when you should start screening, and how often you should be screened.  Colorectal Cancer  Routine colorectal cancer screening usually begins at 50 years of age and should be repeated every 5-10 years until you are 49 years old. You may need to be screened more often if early forms of precancerous polyps or small growths are found. Your health care provider may recommend screening at an earlier age if you have risk factors for colon  cancer.  Your health care provider may recommend using home test kits to check for hidden blood in the stool.  A small camera at the end of a tube can be used to examine your colon (sigmoidoscopy or colonoscopy). This checks for the earliest forms of colorectal cancer.  Prostate and Testicular Cancer  Depending on your age and overall health, your health care provider may do certain tests to screen for prostate and testicular cancer.  Talk to your health care provider about any symptoms or concerns you have about testicular or prostate cancer.  Skin Cancer  Check your skin from head to toe regularly.  Tell your health care provider about any new moles or changes in moles, especially if: ? There is a change in a mole's size, shape, or color. ? You have a mole that is larger than a pencil eraser.  Always use sunscreen. Apply sunscreen liberally and repeat throughout the day.  Protect yourself by wearing long sleeves, pants, a wide-brimmed hat, and sunglasses when outside.  What should I know about heart disease, diabetes, and high blood pressure?    If you are 18-39 years of age, have your blood pressure checked every 3-5 years. If you are 40 years of age or older, have your blood pressure checked every year. You should have your blood pressure measured twice-once when you are at a hospital or clinic, and once when you are not at a hospital or clinic. Record the average of the two measurements. To check your blood pressure when you are not at a hospital or clinic, you can use: ? An automated blood pressure machine at a pharmacy. ? A home blood pressure monitor.  Talk to your health care provider about your target blood pressure.  If you are between 45-79 years old, ask your health care provider if you should take aspirin to prevent heart disease.  Have regular diabetes screenings by checking your fasting blood sugar level. ? If you are at a normal weight and have a low risk for  diabetes, have this test once every three years after the age of 45. ? If you are overweight and have a high risk for diabetes, consider being tested at a younger age or more often.  A one-time screening for abdominal aortic aneurysm (AAA) by ultrasound is recommended for men aged 65-75 years who are current or former smokers. What should I know about preventing infection? Hepatitis B If you have a higher risk for hepatitis B, you should be screened for this virus. Talk with your health care provider to find out if you are at risk for hepatitis B infection. Hepatitis C Blood testing is recommended for:  Everyone born from 1945 through 1965.  Anyone with known risk factors for hepatitis C.  Sexually Transmitted Diseases (STDs)  You should be screened each year for STDs including gonorrhea and chlamydia if: ? You are sexually active and are younger than 49 years of age. ? You are older than 49 years of age and your health care provider tells you that you are at risk for this type of infection. ? Your sexual activity has changed since you were last screened and you are at an increased risk for chlamydia or gonorrhea. Ask your health care provider if you are at risk.  Talk with your health care provider about whether you are at high risk of being infected with HIV. Your health care provider may recommend a prescription medicine to help prevent HIV infection.  What else can I do?  Schedule regular health, dental, and eye exams.  Stay current with your vaccines (immunizations).  Do not use any tobacco products, such as cigarettes, chewing tobacco, and e-cigarettes. If you need help quitting, ask your health care provider.  Limit alcohol intake to no more than 2 drinks per day. One drink equals 12 ounces of beer, 5 ounces of wine, or 1 ounces of hard liquor.  Do not use street drugs.  Do not share needles.  Ask your health care provider for help if you need support or information about  quitting drugs.  Tell your health care provider if you often feel depressed.  Tell your health care provider if you have ever been abused or do not feel safe at home. This information is not intended to replace advice given to you by your health care provider. Make sure you discuss any questions you have with your health care provider. Document Released: 07/19/2007 Document Revised: 09/19/2015 Document Reviewed: 10/24/2014 Elsevier Interactive Patient Education  2018 Elsevier Inc.  

## 2017-04-25 LAB — CBC WITH DIFFERENTIAL/PLATELET
BASOS ABS: 0 10*3/uL (ref 0.0–0.2)
Basos: 0 %
EOS (ABSOLUTE): 0.1 10*3/uL (ref 0.0–0.4)
Eos: 1 %
Hematocrit: 40 % (ref 37.5–51.0)
Hemoglobin: 13.8 g/dL (ref 13.0–17.7)
IMMATURE GRANULOCYTES: 0 %
Immature Grans (Abs): 0 10*3/uL (ref 0.0–0.1)
Lymphocytes Absolute: 2.3 10*3/uL (ref 0.7–3.1)
Lymphs: 35 %
MCH: 30.8 pg (ref 26.6–33.0)
MCHC: 34.5 g/dL (ref 31.5–35.7)
MCV: 89 fL (ref 79–97)
MONOS ABS: 0.5 10*3/uL (ref 0.1–0.9)
Monocytes: 8 %
NEUTROS PCT: 56 %
Neutrophils Absolute: 3.7 10*3/uL (ref 1.4–7.0)
PLATELETS: 221 10*3/uL (ref 150–379)
RBC: 4.48 x10E6/uL (ref 4.14–5.80)
RDW: 12.8 % (ref 12.3–15.4)
WBC: 6.7 10*3/uL (ref 3.4–10.8)

## 2017-04-25 LAB — COMPREHENSIVE METABOLIC PANEL
A/G RATIO: 1.9 (ref 1.2–2.2)
ALT: 26 IU/L (ref 0–44)
AST: 18 IU/L (ref 0–40)
Albumin: 4.7 g/dL (ref 3.5–5.5)
Alkaline Phosphatase: 57 IU/L (ref 39–117)
BUN/Creatinine Ratio: 12 (ref 9–20)
BUN: 13 mg/dL (ref 6–24)
Bilirubin Total: 0.8 mg/dL (ref 0.0–1.2)
CALCIUM: 9.2 mg/dL (ref 8.7–10.2)
CO2: 23 mmol/L (ref 20–29)
CREATININE: 1.1 mg/dL (ref 0.76–1.27)
Chloride: 105 mmol/L (ref 96–106)
GFR calc Af Amer: 91 mL/min/{1.73_m2} (ref >59)
GFR, EST NON AFRICAN AMERICAN: 79 mL/min/{1.73_m2} (ref >59)
GLUCOSE: 105 mg/dL — AB (ref 65–99)
Globulin, Total: 2.5 g/dL (ref 1.5–4.5)
POTASSIUM: 4.3 mmol/L (ref 3.5–5.2)
Sodium: 143 mmol/L (ref 134–144)
Total Protein: 7.2 g/dL (ref 6.0–8.5)

## 2017-04-25 LAB — LIPID PANEL W/O CHOL/HDL RATIO
Cholesterol, Total: 157 mg/dL (ref 100–199)
HDL: 25 mg/dL — AB (ref >39)
LDL Calculated: 111 mg/dL — ABNORMAL HIGH (ref 0–99)
TRIGLYCERIDES: 107 mg/dL (ref 0–149)
VLDL Cholesterol Cal: 21 mg/dL (ref 5–40)

## 2017-04-25 LAB — TSH: TSH: 1.33 u[IU]/mL (ref 0.450–4.500)

## 2017-04-28 ENCOUNTER — Encounter: Payer: Self-pay | Admitting: Family Medicine

## 2017-04-30 ENCOUNTER — Telehealth: Payer: Self-pay

## 2017-04-30 ENCOUNTER — Other Ambulatory Visit: Payer: Self-pay

## 2017-04-30 DIAGNOSIS — Z1211 Encounter for screening for malignant neoplasm of colon: Secondary | ICD-10-CM

## 2017-04-30 NOTE — Telephone Encounter (Signed)
Gastroenterology Pre-Procedure Review  Request Date:  Requesting Physician: Dr.   PATIENT REVIEW QUESTIONS: The patient responded to the following health history questions as indicated:    1. Are you having any GI issues? no 2. Do you have a personal history of Polyps? no 3. Do you have a family history of Colon Cancer or Polyps? no 4. Diabetes Mellitus? no 5. Joint replacements in the past 12 months?no 6. Major health problems in the past 3 months?no 7. Any artificial heart valves, MVP, or defibrillator?no    MEDICATIONS & ALLERGIES:    Patient reports the following regarding taking any anticoagulation/antiplatelet therapy:   Plavix, Coumadin, Eliquis, Xarelto, Lovenox, Pradaxa, Brilinta, or Effient? no Aspirin? no  Patient confirms/reports the following medications:  Current Outpatient Medications  Medication Sig Dispense Refill  . ciclopirox (PENLAC) 8 % solution Apply over nail and surrounding skin daily at bedtime. Apply daily over previous coat. After 7 days, remove w/ alcohol and repeat. 6.6 mL 3  . terbinafine (LAMISIL) 1 % cream Apply 1 application topically 2 (two) times daily. 30 g 0   No current facility-administered medications for this visit.     Patient confirms/reports the following allergies:  No Known Allergies  No orders of the defined types were placed in this encounter.   AUTHORIZATION INFORMATION Primary Insurance: 1D#: Group #:  Secondary Insurance: 1D#: Group #:  SCHEDULE INFORMATION: Date: 05/18/17 Time: Location: [place]

## 2017-05-12 ENCOUNTER — Encounter: Payer: Self-pay | Admitting: *Deleted

## 2017-05-12 ENCOUNTER — Other Ambulatory Visit: Payer: Self-pay

## 2017-05-14 NOTE — Discharge Instructions (Signed)
General Anesthesia, Adult, Care After °These instructions provide you with information about caring for yourself after your procedure. Your health care provider may also give you more specific instructions. Your treatment has been planned according to current medical practices, but problems sometimes occur. Call your health care provider if you have any problems or questions after your procedure. °What can I expect after the procedure? °After the procedure, it is common to have: °· Vomiting. °· A sore throat. °· Mental slowness. ° °It is common to feel: °· Nauseous. °· Cold or shivery. °· Sleepy. °· Tired. °· Sore or achy, even in parts of your body where you did not have surgery. ° °Follow these instructions at home: °For at least 24 hours after the procedure: °· Do not: °? Participate in activities where you could fall or become injured. °? Drive. °? Use heavy machinery. °? Drink alcohol. °? Take sleeping pills or medicines that cause drowsiness. °? Make important decisions or sign legal documents. °? Take care of children on your own. °· Rest. °Eating and drinking °· If you vomit, drink water, juice, or soup when you can drink without vomiting. °· Drink enough fluid to keep your urine clear or pale yellow. °· Make sure you have little or no nausea before eating solid foods. °· Follow the diet recommended by your health care provider. °General instructions °· Have a responsible adult stay with you until you are awake and alert. °· Return to your normal activities as told by your health care provider. Ask your health care provider what activities are safe for you. °· Take over-the-counter and prescription medicines only as told by your health care provider. °· If you smoke, do not smoke without supervision. °· Keep all follow-up visits as told by your health care provider. This is important. °Contact a health care provider if: °· You continue to have nausea or vomiting at home, and medicines are not helpful. °· You  cannot drink fluids or start eating again. °· You cannot urinate after 8-12 hours. °· You develop a skin rash. °· You have fever. °· You have increasing redness at the site of your procedure. °Get help right away if: °· You have difficulty breathing. °· You have chest pain. °· You have unexpected bleeding. °· You feel that you are having a life-threatening or urgent problem. °This information is not intended to replace advice given to you by your health care provider. Make sure you discuss any questions you have with your health care provider. °Document Released: 04/28/2000 Document Revised: 06/25/2015 Document Reviewed: 01/04/2015 °Elsevier Interactive Patient Education © 2018 Elsevier Inc. ° °

## 2017-05-18 ENCOUNTER — Ambulatory Visit: Payer: 59 | Admitting: Anesthesiology

## 2017-05-18 ENCOUNTER — Ambulatory Visit
Admission: RE | Admit: 2017-05-18 | Discharge: 2017-05-18 | Disposition: A | Discharge status: Home / Self Care | Payer: 59 | Source: Ambulatory Visit | Attending: Gastroenterology | Admitting: Gastroenterology

## 2017-05-18 ENCOUNTER — Encounter
Admission: RE | Disposition: A | Discharge status: Home / Self Care | Payer: Self-pay | Source: Ambulatory Visit | Attending: Gastroenterology

## 2017-05-18 DIAGNOSIS — H9190 Unspecified hearing loss, unspecified ear: Secondary | ICD-10-CM | POA: Insufficient documentation

## 2017-05-18 DIAGNOSIS — Z8249 Family history of ischemic heart disease and other diseases of the circulatory system: Secondary | ICD-10-CM | POA: Diagnosis not present

## 2017-05-18 DIAGNOSIS — R0683 Snoring: Secondary | ICD-10-CM | POA: Insufficient documentation

## 2017-05-18 DIAGNOSIS — Z79899 Other long term (current) drug therapy: Secondary | ICD-10-CM | POA: Diagnosis not present

## 2017-05-18 DIAGNOSIS — D125 Benign neoplasm of sigmoid colon: Secondary | ICD-10-CM | POA: Insufficient documentation

## 2017-05-18 DIAGNOSIS — Z1211 Encounter for screening for malignant neoplasm of colon: Secondary | ICD-10-CM

## 2017-05-18 DIAGNOSIS — Z820 Family history of epilepsy and other diseases of the nervous system: Secondary | ICD-10-CM | POA: Insufficient documentation

## 2017-05-18 DIAGNOSIS — Z833 Family history of diabetes mellitus: Secondary | ICD-10-CM | POA: Insufficient documentation

## 2017-05-18 DIAGNOSIS — Z801 Family history of malignant neoplasm of trachea, bronchus and lung: Secondary | ICD-10-CM | POA: Insufficient documentation

## 2017-05-18 DIAGNOSIS — K635 Polyp of colon: Secondary | ICD-10-CM

## 2017-05-18 DIAGNOSIS — Z811 Family history of alcohol abuse and dependence: Secondary | ICD-10-CM | POA: Diagnosis not present

## 2017-05-18 HISTORY — PX: POLYPECTOMY: SHX5525

## 2017-05-18 HISTORY — DX: Presence of spectacles and contact lenses: Z97.3

## 2017-05-18 HISTORY — PX: COLONOSCOPY WITH PROPOFOL: SHX5780

## 2017-05-18 SURGERY — COLONOSCOPY WITH PROPOFOL
Anesthesia: General | Site: Rectum | Wound class: Contaminated

## 2017-05-18 MED ORDER — LACTATED RINGERS IV SOLN
INTRAVENOUS | Status: DC
  Administered 2017-05-18: 08:00:00 via INTRAVENOUS

## 2017-05-18 MED ORDER — SODIUM CHLORIDE 0.9 % IV SOLN: INTRAVENOUS | Status: DC

## 2017-05-18 MED ORDER — ACETAMINOPHEN 160 MG/5ML PO SOLN: 325.0000 mg | ORAL | Status: DC | PRN

## 2017-05-18 MED ORDER — STERILE WATER FOR IRRIGATION IR SOLN
Status: DC | PRN
  Administered 2017-05-18: .5 mL

## 2017-05-18 MED ORDER — ACETAMINOPHEN 325 MG PO TABS: 650.0000 mg | ORAL_TABLET | Freq: Once | ORAL | Status: DC | PRN

## 2017-05-18 MED ORDER — ONDANSETRON HCL 4 MG/2ML IJ SOLN: 4.0000 mg | Freq: Once | INTRAMUSCULAR | Status: DC | PRN

## 2017-05-18 SURGICAL SUPPLY — 9 items
CANISTER SUCT 1200ML W/VALVE (MISCELLANEOUS) ×4 IMPLANT
FORCEPS BIOP RAD 4 LRG CAP 4 (CUTTING FORCEPS) IMPLANT
GAUZE SPONGE 4X4 12PLY STRL (GAUZE/BANDAGES/DRESSINGS) ×4 IMPLANT
GOWN CVR UNV OPN BCK APRN NK (MISCELLANEOUS) ×4 IMPLANT
GOWN ISOL THUMB LOOP REG UNIV (MISCELLANEOUS) ×4
KIT ENDO PROCEDURE OLY (KITS) ×4 IMPLANT
SNARE SHORT THROW 13M SML OVAL (MISCELLANEOUS) IMPLANT
TRAP ETRAP POLY (MISCELLANEOUS) IMPLANT
WATER STERILE IRR 250ML POUR (IV SOLUTION) ×4 IMPLANT

## 2017-05-18 NOTE — H&P (Signed)
Lucilla Lame, MD [address] [place], [place] [address] Phone: [phone] Fax : [phone]  Primary Care Physician:  Valerie Roys, DO Primary Gastroenterologist:  Dr. Allen Norris  Pre-Procedure History & Physical: HPI:  Manuel Heath. is a 47 y.o. male is here for a screening colonoscopy.   Past Medical History:  Diagnosis Date  . Hearing loss   . Wears contact lenses     Past Surgical History:  Procedure Laterality Date  . FOREARM FRACTURE SURGERY     x's 2  . SKIN GRAFT     ankle    Prior to Admission medications   Medication Sig Start Date End Date Taking? Authorizing Provider  ciclopirox (PENLAC) 8 % solution Apply over nail and surrounding skin daily at bedtime. Apply daily over previous coat. After 7 days, remove w/ alcohol and repeat. 04/24/17  Yes Johnson, Megan P, DO  Omega-3 Fatty Acids (FISH OIL PO) Take by mouth.   Yes [provider]  terbinafine (LAMISIL) 1 % cream Apply 1 application topically 2 (two) times daily. 04/24/17  Yes Johnson, Megan P, DO    Allergies as of 04/30/2017  . (No Known Allergies)    Family History  Problem Relation Age of Onset  . Diabetes Mother   . Heart disease Father   . Hypertension Maternal Grandmother   . Alcohol abuse Maternal Grandfather   . Dementia Paternal Grandmother   . Cancer Paternal Grandfather        Lung    Social History   Socioeconomic History  . Marital status: Married    Spouse name: Not on file  . Number of children: Not on file  . Years of education: Not on file  . Highest education level: Not on file  Occupational History  . Not on file  Social Needs  . Financial resource strain: Not on file  . Food insecurity:    Worry: Not on file    Inability: Not on file  . Transportation needs:    Medical: Not on file    Non-medical: Not on file  Tobacco Use  . Smoking status: Never Smoker  . Smokeless tobacco: Never Used  Substance and Sexual Activity  . Alcohol use:  Yes    Alcohol/week: 1.8 oz    Types: 3 Cans of beer per week    Comment: Socially.  . Drug use: No  . Sexual activity: Yes  Lifestyle  . Physical activity:    Days per week: Not on file    Minutes per session: Not on file  . Stress: Not on file  Relationships  . Social connections:    Talks on phone: Not on file    Gets together: Not on file    Attends religious service: Not on file    Active member of club or organization: Not on file    Attends meetings of clubs or organizations: Not on file    Relationship status: Not on file  . Intimate partner violence:    Fear of current or ex partner: Not on file    Emotionally abused: Not on file    Physically abused: Not on file    Forced sexual activity: Not on file  Other Topics Concern  . Not on file  Social History Narrative  . Not on file    Review of Systems: See HPI, otherwise negative ROS  Physical Exam: BP (!) 148/77   Pulse 77   Temp 97.9 F (36.6 C) (Temporal)  Resp 16   Ht 5\' 6"  (1.676 m)   Wt 198 lb (89.8 kg)   SpO2 100%   BMI 31.96 kg/m  General:   Alert,  pleasant and cooperative in NAD Head:  Normocephalic and atraumatic. Neck:  Supple; no masses or thyromegaly. Lungs:  Clear throughout to auscultation.    Heart:  Regular rate and rhythm. Abdomen:  Soft, nontender and nondistended. Normal bowel sounds, without guarding, and without rebound.   Neurologic:  Alert and  oriented x4;  grossly normal neurologically.  Impression/Plan: Manuel Heath. is now here to undergo a screening colonoscopy.  Risks, benefits, and alternatives regarding colonoscopy have been reviewed with the patient.  Questions have been answered.  All parties agreeable.

## 2017-05-18 NOTE — Op Note (Signed)
[facility] Patient Name: Manuel Heath Procedure Date: 05/18/2017 8:31 AM MRN: [id number] Account #: [id number] Date of Birth: 04-Jul-1970 Admit Type: Outpatient Age: 47 Room: [facility] OR ROOM 01 Gender: Male Note Status: Finalized Procedure:            Colonoscopy Indications:          Screening for colorectal malignant neoplasm Providers:            Lucilla Lame MD, MD Referring MD:         Valerie Roys (Referring MD) Medicines:            Propofol per Anesthesia Complications:        No immediate complications. Procedure:            Pre-Anesthesia Assessment:                       - Prior to the procedure, a History and Physical was                        performed, and patient medications and allergies were                        reviewed. The patient's tolerance of previous                        anesthesia was also reviewed. The risks and benefits of                        the procedure and the sedation options and risks were                        discussed with the patient. All questions were                        answered, and informed consent was obtained. Prior                        Anticoagulants: The patient has taken no previous                        anticoagulant or antiplatelet agents. ASA Grade                        Assessment: II - A patient with mild systemic disease.                        After reviewing the risks and benefits, the patient was                        deemed in satisfactory condition to undergo the                        procedure.                       After obtaining informed consent, the colonoscope was                        passed under direct vision. Throughout the procedure,  the patient's blood pressure, pulse, and oxygen                        saturations were monitored continuously. The [place] [id number]) was introduced through the                         anus and advanced to the the cecum, identified by                        appendiceal orifice and ileocecal valve. The                        colonoscopy was performed without difficulty. The                        patient tolerated the procedure well. The quality of                        the bowel preparation was excellent. Findings:      The perianal and digital rectal examinations were normal.      A 2 mm polyp was found in the sigmoid colon. The polyp was sessile. The       polyp was removed with a cold biopsy forceps. Resection and retrieval       were complete. Impression:           - One 2 mm polyp in the sigmoid colon, removed with a                        cold biopsy forceps. Resected and retrieved. Recommendation:       - Discharge patient to home.                       - Resume previous diet.                       - Continue present medications.                       - Await pathology results.                       - Repeat colonoscopy in 5 years if polyp adenoma and 10                        years if hyperplastic Procedure Code(s):    --- Professional ---                       [phone], Colonoscopy, flexible; with biopsy, single or                        multiple Diagnosis Code(s):    --- Professional ---                       Z12.11, Encounter for screening for malignant neoplasm                        of colon  D12.5, Benign neoplasm of sigmoid colon CPT copyright 2017 American Medical Association. All rights reserved. The codes documented in this report are preliminary and upon coder review may  be revised to meet current compliance requirements. Lucilla Lame MD, MD 05/18/2017 8:51:42 AM This report has been signed electronically. Number of Addenda: 0 Note Initiated On: 05/18/2017 8:31 AM Scope Withdrawal Time: 0 hours 7 minutes 4 seconds  Total Procedure Duration: 0 hours 9 minutes 16 seconds       [facility]

## 2017-05-18 NOTE — Anesthesia Preprocedure Evaluation (Signed)
Anesthesia Evaluation  Patient identified by MRN, date of birth, ID band Patient awake    Reviewed: Allergy & Precautions, NPO status , Patient's Chart, lab work & pertinent test results  History of Anesthesia Complications Negative for: history of anesthetic complications  Airway Mallampati: II  TM Distance: >3 FB Neck ROM: Full    Dental no notable dental hx.    Pulmonary  Snoring    Pulmonary exam normal breath sounds clear to auscultation       Cardiovascular Exercise Tolerance: Good negative cardio ROS Normal cardiovascular exam Rhythm:Regular Rate:Normal     Neuro/Psych HOH    GI/Hepatic negative GI ROS,   Endo/Other  negative endocrine ROS  Renal/GU negative Renal ROS     Musculoskeletal   Abdominal   Peds  Hematology negative hematology ROS (+)   Anesthesia Other Findings   Reproductive/Obstetrics                             Anesthesia Physical Anesthesia Plan  ASA: II  Anesthesia Plan: General   Post-op Pain Management:    Induction: Intravenous  PONV Risk Score and Plan: 1 and Propofol infusion and TIVA  Airway Management Planned: Natural Airway  Additional Equipment:   Intra-op Plan:   Post-operative Plan:   Informed Consent: I have reviewed the patients History and Physical, chart, labs and discussed the procedure including the risks, benefits and alternatives for the proposed anesthesia with the patient or authorized representative who has indicated his/her understanding and acceptance.     Plan Discussed with: CRNA  Anesthesia Plan Comments:         Anesthesia Quick Evaluation

## 2017-05-18 NOTE — Transfer of Care (Signed)
Immediate Anesthesia Transfer of Care Note  Patient: Manuel Heath.  Procedure(s) Performed: COLONOSCOPY WITH PROPOFOL (N/A Rectum)  Patient Location: PACU  Anesthesia Type: General  Level of Consciousness: awake, alert  and patient cooperative  Airway and Oxygen Therapy: Patient Spontanous Breathing and Patient connected to supplemental oxygen  Post-op Assessment: Post-op Vital signs reviewed, Patient's Cardiovascular Status Stable, Respiratory Function Stable, Patent Airway and No signs of Nausea or vomiting  Post-op Vital Signs: Reviewed and stable  Complications: No apparent anesthesia complications

## 2017-05-18 NOTE — Anesthesia Postprocedure Evaluation (Signed)
Anesthesia Post Note  Patient: Manuel Heath.  Procedure(s) Performed: COLONOSCOPY WITH PROPOFOL (N/A Rectum) POLYPECTOMY (Rectum)  Patient location during evaluation: PACU Anesthesia Type: General Level of consciousness: awake and alert, oriented and patient cooperative Pain management: pain level controlled Vital Signs Assessment: post-procedure vital signs reviewed and stable Respiratory status: spontaneous breathing, nonlabored ventilation and respiratory function stable Cardiovascular status: blood pressure returned to baseline and stable Postop Assessment: adequate PO intake Anesthetic complications: no    Darrin Nipper

## 2017-05-19 ENCOUNTER — Encounter: Payer: Self-pay | Admitting: Gastroenterology

## 2017-05-21 ENCOUNTER — Encounter: Payer: Self-pay | Admitting: Gastroenterology

## 2018-04-28 ENCOUNTER — Encounter: Payer: 59 | Admitting: Family Medicine

## 2018-08-04 ENCOUNTER — Encounter: Payer: Self-pay | Admitting: Family Medicine

## 2018-09-28 ENCOUNTER — Ambulatory Visit (INDEPENDENT_AMBULATORY_CARE_PROVIDER_SITE_OTHER): Payer: 59 | Admitting: Family Medicine

## 2018-09-28 ENCOUNTER — Telehealth: Payer: Self-pay

## 2018-09-28 ENCOUNTER — Encounter: Payer: Self-pay | Admitting: Family Medicine

## 2018-09-28 ENCOUNTER — Other Ambulatory Visit: Payer: Self-pay

## 2018-09-28 VITALS — BP 137/83 | HR 71 | Temp 98.7°F | Ht 66.1 in | Wt 205.0 lb

## 2018-09-28 DIAGNOSIS — Z0000 Encounter for general adult medical examination without abnormal findings: Secondary | ICD-10-CM

## 2018-09-28 DIAGNOSIS — Z114 Encounter for screening for human immunodeficiency virus [HIV]: Secondary | ICD-10-CM | POA: Diagnosis not present

## 2018-09-28 LAB — UA/M W/RFLX CULTURE, ROUTINE
Bilirubin, UA: NEGATIVE
Glucose, UA: NEGATIVE
Ketones, UA: NEGATIVE
Leukocytes,UA: NEGATIVE
Nitrite, UA: NEGATIVE
Protein,UA: NEGATIVE
RBC, UA: NEGATIVE
Specific Gravity, UA: 1.02 (ref 1.005–1.030)
Urobilinogen, Ur: 0.2 mg/dL (ref 0.2–1.0)
pH, UA: 5.5 (ref 5.0–7.5)

## 2018-09-28 MED ORDER — CICLOPIROX 8 % EX SOLN: CUTANEOUS | 3 refills | Status: DC

## 2018-09-28 NOTE — Patient Instructions (Signed)

## 2018-09-28 NOTE — Telephone Encounter (Signed)
PA submitted via cover my meds for Ciclopirox 8% solution. Awaiting approval or denial.

## 2018-09-28 NOTE — Progress Notes (Signed)
BP 137/83   Pulse 71   Temp 98.7 F (37.1 C) (Oral)   Ht 5' 6.1" (1.679 m)   Wt 205 lb (93 kg)   SpO2 98%   BMI 32.99 kg/m    Subjective:    Patient ID: Manuel Heath., male    DOB: [birth year], 48 y.o.   MRN: [id number]  HPI: Manuel Heath. is a 48 y.o. male presenting on 09/28/2018 for comprehensive medical examination. Current medical complaints include:none  Interim Problems from his last visit: no  Depression Screen done today and results listed below:  Depression screen [facility] 2/9 09/28/2018 04/24/2017 11/28/2014  Decreased Interest 0 0 0  Down, Depressed, Hopeless 0 0 0  PHQ - 2 Score 0 0 0  Altered sleeping 0 0 -  Tired, decreased energy 0 0 -  Change in appetite 0 0 -  Feeling bad or failure about yourself  0 0 -  Trouble concentrating 0 0 -  Moving slowly or fidgety/restless 0 0 -  Suicidal thoughts 0 0 -  PHQ-9 Score 0 0 -  Difficult doing work/chores Not difficult at all - -    Past Medical History:  Past Medical History:  Diagnosis Date  . Hearing loss   . Wears contact lenses     Surgical History:  Past Surgical History:  Procedure Laterality Date  . COLONOSCOPY WITH PROPOFOL N/A 05/18/2017   Procedure: COLONOSCOPY WITH PROPOFOL;  Surgeon: Lucilla Lame, MD;  Location: [place];  Service: Endoscopy;  Laterality: N/A;  . FOREARM FRACTURE SURGERY     x's 2  . POLYPECTOMY  05/18/2017   Procedure: POLYPECTOMY;  Surgeon: Lucilla Lame, MD;  Location: [place];  Service: Endoscopy;;  . SKIN GRAFT     ankle    Medications:  No current outpatient medications on file prior to visit.   No current facility-administered medications on file prior to visit.     Allergies:  No Known Allergies  Social History:  Social History   Socioeconomic History  . Marital status: Married    Spouse name: Not on file  . Number of children: Not on file  . Years of education: Not on file  . Highest education level: Not on file  Occupational  History  . Not on file  Social Needs  . Financial resource strain: Not on file  . Food insecurity    Worry: Not on file    Inability: Not on file  . Transportation needs    Medical: Not on file    Non-medical: Not on file  Tobacco Use  . Smoking status: Never Smoker  . Smokeless tobacco: Never Used  Substance and Sexual Activity  . Alcohol use: Yes    Alcohol/week: 3.0 standard drinks    Types: 3 Cans of beer per week    Comment: Socially.  . Drug use: No  . Sexual activity: Yes  Lifestyle  . Physical activity    Days per week: Not on file    Minutes per session: Not on file  . Stress: Not on file  Relationships  . Social Herbalist on phone: Not on file    Gets together: Not on file    Attends religious service: Not on file    Active member of club or organization: Not on file    Attends meetings of clubs or organizations: Not on file    Relationship status: Not on file  . Intimate partner violence  Fear of current or ex partner: Not on file    Emotionally abused: Not on file    Physically abused: Not on file    Forced sexual activity: Not on file  Other Topics Concern  . Not on file  Social History Narrative  . Not on file   Social History   Tobacco Use  Smoking Status Never Smoker  Smokeless Tobacco Never Used   Social History   Substance and Sexual Activity  Alcohol Use Yes  . Alcohol/week: 3.0 standard drinks  . Types: 3 Cans of beer per week   Comment: Socially.    Family History:  Family History  Problem Relation Age of Onset  . Diabetes Mother   . Heart disease Father   . Hypertension Maternal Grandmother   . Alcohol abuse Maternal Grandfather   . Dementia Paternal Grandmother   . Cancer Paternal Grandfather        Lung    Past medical history, surgical history, medications, allergies, family history and social history reviewed with patient today and changes made to appropriate areas of the chart.   Review of Systems   Constitutional: Negative.   HENT: Positive for hearing loss and tinnitus. Negative for congestion, ear discharge, ear pain, nosebleeds, sinus pain and sore throat.   Eyes: Negative.   Respiratory: Negative.  Negative for stridor.   Cardiovascular: Negative.   Gastrointestinal: Positive for heartburn. Negative for abdominal pain, blood in stool, constipation, diarrhea, melena, nausea and vomiting.  Genitourinary: Negative.   Musculoskeletal: Negative.   Skin: Negative.        Irritated skin tags at the back of his neck  Neurological: Negative.   Endo/Heme/Allergies: Negative.   Psychiatric/Behavioral: Negative.     All other ROS negative except what is listed above and in the HPI.      Objective:    BP 137/83   Pulse 71   Temp 98.7 F (37.1 C) (Oral)   Ht 5' 6.1" (1.679 m)   Wt 205 lb (93 kg)   SpO2 98%   BMI 32.99 kg/m   Wt Readings from Last 3 Encounters:  09/28/18 205 lb (93 kg)  05/18/17 198 lb (89.8 kg)  04/24/17 201 lb 5 oz (91.3 kg)    Physical Exam Vitals signs and nursing note reviewed.  Constitutional:      General: He is not in acute distress.    Appearance: Normal appearance. He is obese. He is not ill-appearing, toxic-appearing or diaphoretic.  HENT:     Head: Normocephalic and atraumatic.     Right Ear: Tympanic membrane, ear canal and external ear normal. There is no impacted cerumen.     Left Ear: Tympanic membrane, ear canal and external ear normal. There is no impacted cerumen.     Nose: Nose normal. No congestion or rhinorrhea.     Mouth/Throat:     Mouth: Mucous membranes are moist.     Pharynx: Oropharynx is clear. No oropharyngeal exudate or posterior oropharyngeal erythema.  Eyes:     General: No scleral icterus.       Right eye: No discharge.        Left eye: No discharge.     Extraocular Movements: Extraocular movements intact.     Conjunctiva/sclera: Conjunctivae normal.     Pupils: Pupils are equal, round, and reactive to light.  Neck:      Musculoskeletal: Normal range of motion and neck supple. No neck rigidity or muscular tenderness.     Vascular: No carotid  bruit.  Cardiovascular:     Rate and Rhythm: Normal rate and regular rhythm.     Pulses: Normal pulses.     Heart sounds: No murmur. No friction rub. No gallop.   Pulmonary:     Effort: Pulmonary effort is normal. No respiratory distress.     Breath sounds: Normal breath sounds. No stridor. No wheezing, rhonchi or rales.  Chest:     Chest wall: No tenderness.  Abdominal:     General: Abdomen is flat. Bowel sounds are normal. There is no distension.     Palpations: Abdomen is soft. There is no mass.     Tenderness: There is no abdominal tenderness. There is no right CVA tenderness, left CVA tenderness, guarding or rebound.     Hernia: No hernia is present.  Genitourinary:    Comments: Genital exam deferred with shared decision making Musculoskeletal:        General: No swelling, tenderness, deformity or signs of injury.     Right lower leg: No edema.     Left lower leg: No edema.  Lymphadenopathy:     Cervical: No cervical adenopathy.  Skin:    General: Skin is warm and dry.     Capillary Refill: Capillary refill takes less than 2 seconds.     Coloration: Skin is not jaundiced or pale.     Findings: No bruising, erythema, lesion or rash.  Neurological:     General: No focal deficit present.     Mental Status: He is alert and oriented to person, place, and time.     Cranial Nerves: No cranial nerve deficit.     Sensory: No sensory deficit.     Motor: No weakness.     Coordination: Coordination normal.     Gait: Gait normal.     Deep Tendon Reflexes: Reflexes normal.  Psychiatric:        Mood and Affect: Mood normal.        Behavior: Behavior normal.        Thought Content: Thought content normal.        Judgment: Judgment normal.     Results for orders placed or performed in visit on 04/24/17  CBC with Differential/Platelet  Result Value Ref  Range   WBC 6.7 3.4 - 10.8 x10E3/uL   RBC 4.48 4.14 - 5.80 x10E6/uL   Hemoglobin 13.8 13.0 - 17.7 g/dL   Hematocrit 40.0 37.5 - 51.0 %   MCV 89 79 - 97 fL   MCH 30.8 26.6 - 33.0 pg   MCHC 34.5 31.5 - 35.7 g/dL   RDW 12.8 12.3 - 15.4 %   Platelets 221 150 - 379 x10E3/uL   Neutrophils 56 Not Estab. %   Lymphs 35 Not Estab. %   Monocytes 8 Not Estab. %   Eos 1 Not Estab. %   Basos 0 Not Estab. %   Neutrophils Absolute 3.7 1.4 - 7.0 x10E3/uL   Lymphocytes Absolute 2.3 0.7 - 3.1 x10E3/uL   Monocytes Absolute 0.5 0.1 - 0.9 x10E3/uL   EOS (ABSOLUTE) 0.1 0.0 - 0.4 x10E3/uL   Basophils Absolute 0.0 0.0 - 0.2 x10E3/uL   Immature Granulocytes 0 Not Estab. %   Immature Grans (Abs) 0.0 0.0 - 0.1 x10E3/uL  Comprehensive metabolic panel  Result Value Ref Range   Glucose 105 (H) 65 - 99 mg/dL   BUN 13 6 - 24 mg/dL   Creatinine, Ser 1.10 0.76 - 1.27 mg/dL   GFR calc non Af Amer 79 >59  mL/min/1.73   GFR calc Af Amer 91 >59 mL/min/1.73   BUN/Creatinine Ratio 12 9 - 20   Sodium 143 134 - 144 mmol/L   Potassium 4.3 3.5 - 5.2 mmol/L   Chloride 105 96 - 106 mmol/L   CO2 23 20 - 29 mmol/L   Calcium 9.2 8.7 - 10.2 mg/dL   Total Protein 7.2 6.0 - 8.5 g/dL   Albumin 4.7 3.5 - 5.5 g/dL   Globulin, Total 2.5 1.5 - 4.5 g/dL   Albumin/Globulin Ratio 1.9 1.2 - 2.2   Bilirubin Total 0.8 0.0 - 1.2 mg/dL   Alkaline Phosphatase 57 39 - 117 IU/L   AST 18 0 - 40 IU/L   ALT 26 0 - 44 IU/L  Lipid Panel w/o Chol/HDL Ratio  Result Value Ref Range   Cholesterol, Total 157 100 - 199 mg/dL   Triglycerides 107 0 - 149 mg/dL   HDL 25 (L) >39 mg/dL   VLDL Cholesterol Cal 21 5 - 40 mg/dL   LDL Calculated 111 (H) 0 - 99 mg/dL  TSH  Result Value Ref Range   TSH 1.330 0.450 - 4.500 uIU/mL  UA/M w/rflx Culture, Routine   Specimen: Urine   URINE  Result Value Ref Range   Specific Gravity, UA 1.020 1.005 - 1.030   pH, UA 5.0 5.0 - 7.5   Color, UA Yellow Yellow   Appearance Ur Clear Clear   Leukocytes, UA  Negative Negative   Protein, UA Negative Negative/Trace   Glucose, UA Negative Negative   Ketones, UA Negative Negative   RBC, UA Negative Negative   Bilirubin, UA Negative Negative   Urobilinogen, Ur 0.2 0.2 - 1.0 mg/dL   Nitrite, UA Negative Negative  Microalbumin, Urine Waived  Result Value Ref Range   Microalb, Ur Waived 10 0 - 19 mg/L   Creatinine, Urine Waived 300 10 - 300 mg/dL   Microalb/Creat Ratio <30 <30 mg/g      Assessment & Plan:   Problem List Items Addressed This Visit    None    Visit Diagnoses    Routine general medical examination at a health care facility    -  Primary   Vaccines up to date. Screening labs checked today. Colonoscopy up to date. Continue diet and exercise. Call with any concerns.    Relevant Orders   CBC with Differential/Platelet   Comprehensive metabolic panel   Lipid Panel w/o Chol/HDL Ratio   PSA   TSH   UA/M w/rflx Culture, Routine   Encounter for screening for HIV       Labs drawn today. Await results.    Relevant Orders   HIV Antibody (routine testing w rflx)       LABORATORY TESTING:  Health maintenance labs ordered today as discussed above.   The natural history of prostate cancer and ongoing controversy regarding screening and potential treatment outcomes of prostate cancer has been discussed with the patient. The meaning of a false positive PSA and a false negative PSA has been discussed. He indicates understanding of the limitations of this screening test and wishes to proceed with screening PSA testing.   IMMUNIZATIONS:   - Tdap: Tetanus vaccination status reviewed: last tetanus booster within 10 years. - Influenza: Refused - Pneumovax: Not applicable  PATIENT COUNSELING:    Sexuality: Discussed sexually transmitted diseases, partner selection, use of condoms, avoidance of unintended pregnancy  and contraceptive alternatives.   Advised to avoid cigarette smoking.  I discussed with the patient that most people  either abstain from alcohol or drink within safe limits (<=14/week and <=4 drinks/occasion for males, <=7/weeks and <= 3 drinks/occasion for females) and that the risk for alcohol disorders and other health effects rises proportionally with the number of drinks per week and how often a drinker exceeds daily limits.  Discussed cessation/primary prevention of drug use and availability of treatment for abuse.   Diet: Encouraged to adjust caloric intake to maintain  or achieve ideal body weight, to reduce intake of dietary saturated fat and total fat, to limit sodium intake by avoiding high sodium foods and not adding table salt, and to maintain adequate dietary potassium and calcium preferably from fresh fruits, vegetables, and low-fat dairy products.    stressed the importance of regular exercise  Injury prevention: Discussed safety belts, safety helmets, smoke detector, smoking near bedding or upholstery.   Dental health: Discussed importance of regular tooth brushing, flossing, and dental visits.   Follow up plan: NEXT PREVENTATIVE PHYSICAL DUE IN 1 YEAR. Return in about 1 year (around 09/28/2019) for Physical.

## 2018-09-29 ENCOUNTER — Encounter: Payer: Self-pay | Admitting: Family Medicine

## 2018-09-29 LAB — LIPID PANEL W/O CHOL/HDL RATIO
Cholesterol, Total: 169 mg/dL (ref 100–199)
HDL: 28 mg/dL — ABNORMAL LOW (ref >39)
LDL Calculated: 106 mg/dL — ABNORMAL HIGH (ref 0–99)
Triglycerides: 177 mg/dL — ABNORMAL HIGH (ref 0–149)
VLDL Cholesterol Cal: 35 mg/dL (ref 5–40)

## 2018-09-29 LAB — CBC WITH DIFFERENTIAL/PLATELET
Basophils Absolute: 0 10*3/uL (ref 0.0–0.2)
Basos: 0 %
EOS (ABSOLUTE): 0.1 10*3/uL (ref 0.0–0.4)
Eos: 2 %
Hematocrit: 47 % (ref 37.5–51.0)
Hemoglobin: 15 g/dL (ref 13.0–17.7)
Immature Grans (Abs): 0 10*3/uL (ref 0.0–0.1)
Immature Granulocytes: 0 %
Lymphocytes Absolute: 1.9 10*3/uL (ref 0.7–3.1)
Lymphs: 33 %
MCH: 30.4 pg (ref 26.6–33.0)
MCHC: 31.9 g/dL (ref 31.5–35.7)
MCV: 95 fL (ref 79–97)
Monocytes Absolute: 0.4 10*3/uL (ref 0.1–0.9)
Monocytes: 7 %
Neutrophils Absolute: 3.4 10*3/uL (ref 1.4–7.0)
Neutrophils: 58 %
Platelets: 198 10*3/uL (ref 150–450)
RBC: 4.93 x10E6/uL (ref 4.14–5.80)
RDW: 11.9 % (ref 11.6–15.4)
WBC: 5.9 10*3/uL (ref 3.4–10.8)

## 2018-09-29 LAB — COMPREHENSIVE METABOLIC PANEL
ALT: 17 IU/L (ref 0–44)
AST: 15 IU/L (ref 0–40)
Albumin/Globulin Ratio: 2.1 (ref 1.2–2.2)
Albumin: 4.7 g/dL (ref 4.0–5.0)
Alkaline Phosphatase: 60 IU/L (ref 39–117)
BUN/Creatinine Ratio: 16 (ref 9–20)
BUN: 17 mg/dL (ref 6–24)
Bilirubin Total: 1.2 mg/dL (ref 0.0–1.2)
CO2: 23 mmol/L (ref 20–29)
Calcium: 9.4 mg/dL (ref 8.7–10.2)
Chloride: 102 mmol/L (ref 96–106)
Creatinine, Ser: 1.05 mg/dL (ref 0.76–1.27)
GFR calc Af Amer: 96 mL/min/{1.73_m2} (ref >59)
GFR calc non Af Amer: 83 mL/min/{1.73_m2} (ref >59)
Globulin, Total: 2.2 g/dL (ref 1.5–4.5)
Glucose: 118 mg/dL — ABNORMAL HIGH (ref 65–99)
Potassium: 4.4 mmol/L (ref 3.5–5.2)
Sodium: 141 mmol/L (ref 134–144)
Total Protein: 6.9 g/dL (ref 6.0–8.5)

## 2018-09-29 LAB — TSH: TSH: 1.7 u[IU]/mL (ref 0.450–4.500)

## 2018-09-29 LAB — HIV ANTIBODY (ROUTINE TESTING W REFLEX): HIV Screen 4th Generation wRfx: NONREACTIVE

## 2018-09-29 LAB — PSA: Prostate Specific Ag, Serum: 0.7 ng/mL (ref 0.0–4.0)

## 2018-11-26 ENCOUNTER — Other Ambulatory Visit: Payer: Self-pay

## 2018-11-26 ENCOUNTER — Ambulatory Visit (INDEPENDENT_AMBULATORY_CARE_PROVIDER_SITE_OTHER): Payer: 59 | Admitting: Family Medicine

## 2018-11-26 ENCOUNTER — Encounter: Payer: Self-pay | Admitting: Family Medicine

## 2018-11-26 VITALS — HR 64 | Temp 99.0°F

## 2018-11-26 DIAGNOSIS — M7661 Achilles tendinitis, right leg: Secondary | ICD-10-CM

## 2018-11-26 MED ORDER — TRIAMCINOLONE ACETONIDE 40 MG/ML IJ SUSP
40.0000 mg | Freq: Once | INTRAMUSCULAR | Status: AC
  Administered 2018-11-26: 40 mg via INTRAMUSCULAR

## 2018-11-26 NOTE — Progress Notes (Signed)
Pulse 64   Temp 99 F (37.2 C)   SpO2 98%    Subjective:    Patient ID: Manuel Ruffing., male    DOB: 1970/07/17, 48 y.o.   MRN: [id number]  HPI: Manuel Himmelberger. is a 48 y.o. male  Chief Complaint  Patient presents with  . Foot Pain    right, no known injury   Here today with 2 days of right heel pain that seemed to start after a long day of golfing. Some swelling at achilles but no redness,bruising, lesions. Weight bearing makes worse, rest improves it. No known injury, wore his typical golfing shoes. Not trying anything for relief.   Relevant past medical, surgical, family and social history reviewed and updated as indicated. Interim medical history since our last visit reviewed. Allergies and medications reviewed and updated.  Review of Systems  Per HPI unless specifically indicated above     Objective:    Pulse 64   Temp 99 F (37.2 C)   SpO2 98%   Wt Readings from Last 3 Encounters:  09/28/18 205 lb (93 kg)  05/18/17 198 lb (89.8 kg)  04/24/17 201 lb 5 oz (91.3 kg)    Physical Exam Vitals signs and nursing note reviewed.  Constitutional:      Appearance: Normal appearance.  HENT:     Head: Atraumatic.  Eyes:     Extraocular Movements: Extraocular movements intact.     Conjunctiva/sclera: Conjunctivae normal.  Neck:     Musculoskeletal: Normal range of motion and neck supple.  Cardiovascular:     Rate and Rhythm: Normal rate and regular rhythm.  Pulmonary:     Effort: Pulmonary effort is normal.     Breath sounds: Normal breath sounds.  Musculoskeletal: Normal range of motion.        General: Swelling (right achilles tendon) and tenderness present.  Skin:    General: Skin is warm and dry.     Findings: No erythema or lesion.  Neurological:     General: No focal deficit present.     Mental Status: He is oriented to person, place, and time.  Psychiatric:        Mood and Affect: Mood normal.        Thought Content: Thought content normal.      Judgment: Judgment normal.     Results for orders placed or performed in visit on 09/28/18  HIV Antibody (routine testing w rflx)  Result Value Ref Range   HIV Screen 4th Generation wRfx Non Reactive Non Reactive  CBC with Differential/Platelet  Result Value Ref Range   WBC 5.9 3.4 - 10.8 x10E3/uL   RBC 4.93 4.14 - 5.80 x10E6/uL   Hemoglobin 15.0 13.0 - 17.7 g/dL   Hematocrit 47.0 37.5 - 51.0 %   MCV 95 79 - 97 fL   MCH 30.4 26.6 - 33.0 pg   MCHC 31.9 31.5 - 35.7 g/dL   RDW 11.9 11.6 - 15.4 %   Platelets 198 150 - 450 x10E3/uL   Neutrophils 58 Not Estab. %   Lymphs 33 Not Estab. %   Monocytes 7 Not Estab. %   Eos 2 Not Estab. %   Basos 0 Not Estab. %   Neutrophils Absolute 3.4 1.4 - 7.0 x10E3/uL   Lymphocytes Absolute 1.9 0.7 - 3.1 x10E3/uL   Monocytes Absolute 0.4 0.1 - 0.9 x10E3/uL   EOS (ABSOLUTE) 0.1 0.0 - 0.4 x10E3/uL   Basophils Absolute 0.0 0.0 - 0.2 x10E3/uL   Immature  Granulocytes 0 Not Estab. %   Immature Grans (Abs) 0.0 0.0 - 0.1 x10E3/uL  Comprehensive metabolic panel  Result Value Ref Range   Glucose 118 (H) 65 - 99 mg/dL   BUN 17 6 - 24 mg/dL   Creatinine, Ser 1.05 0.76 - 1.27 mg/dL   GFR calc non Af Amer 83 >59 mL/min/1.73   GFR calc Af Amer 96 >59 mL/min/1.73   BUN/Creatinine Ratio 16 9 - 20   Sodium 141 134 - 144 mmol/L   Potassium 4.4 3.5 - 5.2 mmol/L   Chloride 102 96 - 106 mmol/L   CO2 23 20 - 29 mmol/L   Calcium 9.4 8.7 - 10.2 mg/dL   Total Protein 6.9 6.0 - 8.5 g/dL   Albumin 4.7 4.0 - 5.0 g/dL   Globulin, Total 2.2 1.5 - 4.5 g/dL   Albumin/Globulin Ratio 2.1 1.2 - 2.2   Bilirubin Total 1.2 0.0 - 1.2 mg/dL   Alkaline Phosphatase 60 39 - 117 IU/L   AST 15 0 - 40 IU/L   ALT 17 0 - 44 IU/L  Lipid Panel w/o Chol/HDL Ratio  Result Value Ref Range   Cholesterol, Total 169 100 - 199 mg/dL   Triglycerides 177 (H) 0 - 149 mg/dL   HDL 28 (L) >39 mg/dL   VLDL Cholesterol Cal 35 5 - 40 mg/dL   LDL Calculated 106 (H) 0 - 99 mg/dL  PSA  Result Value  Ref Range   Prostate Specific Ag, Serum 0.7 0.0 - 4.0 ng/mL  TSH  Result Value Ref Range   TSH 1.700 0.450 - 4.500 uIU/mL  UA/M w/rflx Culture, Routine   Specimen: Urine   URINE  Result Value Ref Range   Specific Gravity, UA 1.020 1.005 - 1.030   pH, UA 5.5 5.0 - 7.5   Color, UA Yellow Yellow   Appearance Ur Clear Clear   Leukocytes,UA Negative Negative   Protein,UA Negative Negative/Trace   Glucose, UA Negative Negative   Ketones, UA Negative Negative   RBC, UA Negative Negative   Bilirubin, UA Negative Negative   Urobilinogen, Ur 0.2 0.2 - 1.0 mg/dL   Nitrite, UA Negative Negative      Assessment & Plan:   Problem List Items Addressed This Visit    None    Visit Diagnoses    Achilles tendinitis of right lower extremity    -  Primary   IM kenalog given, epsom salt soaks and stretches reviewed at length. F/u if worsening or not improving   Relevant Medications   triamcinolone acetonide (KENALOG-40) injection 40 mg (Completed)       Follow up plan: Return if symptoms worsen or fail to improve.

## 2018-12-30 ENCOUNTER — Encounter: Payer: Self-pay | Admitting: Family Medicine

## 2019-01-03 ENCOUNTER — Other Ambulatory Visit: Payer: Self-pay | Admitting: Family Medicine

## 2019-01-03 DIAGNOSIS — M7661 Achilles tendinitis, right leg: Secondary | ICD-10-CM

## 2019-01-04 ENCOUNTER — Other Ambulatory Visit: Payer: Self-pay

## 2019-01-04 ENCOUNTER — Ambulatory Visit
Admission: RE | Admit: 2019-01-04 | Discharge: 2019-01-04 | Disposition: A | Discharge status: Home / Self Care | Payer: 59 | Source: Ambulatory Visit | Attending: Family Medicine | Admitting: Family Medicine

## 2019-01-04 ENCOUNTER — Ambulatory Visit
Admission: RE | Admit: 2019-01-04 | Discharge: 2019-01-04 | Disposition: A | Discharge status: Home / Self Care | Payer: 59 | Attending: Family Medicine | Admitting: Family Medicine

## 2019-01-04 ENCOUNTER — Encounter: Payer: Self-pay | Admitting: Family Medicine

## 2019-01-04 ENCOUNTER — Telehealth: Payer: Self-pay | Admitting: Family Medicine

## 2019-01-04 DIAGNOSIS — M7661 Achilles tendinitis, right leg: Secondary | ICD-10-CM | POA: Diagnosis present

## 2019-01-04 NOTE — Telephone Encounter (Signed)
Called pt back he states that his only concern is the achilles and that is what the imaging appt is for. He states that he will cancel the appt for tomorrow and wait for results.

## 2019-01-04 NOTE — Telephone Encounter (Signed)
Called pt to go over script screening for tomorrow's appt. He states that he is going to [facility] imagining this afternoon and is wanting to know if he should cancel his appt for tomorrow. Please advise.

## 2019-01-04 NOTE — Telephone Encounter (Signed)
If the appointment is just about his achilles he can just get the imaging and we will chat about the results from there but if he's got other things he wants to discuss or wants to have a visit about the heel pain either way then he should keep the appt

## 2019-01-05 ENCOUNTER — Ambulatory Visit: Payer: 59 | Admitting: Family Medicine

## 2019-01-07 ENCOUNTER — Encounter: Payer: Self-pay | Admitting: Family Medicine

## 2019-01-07 ENCOUNTER — Other Ambulatory Visit: Payer: Self-pay | Admitting: Family Medicine

## 2019-01-07 DIAGNOSIS — M7731 Calcaneal spur, right foot: Secondary | ICD-10-CM

## 2019-01-13 ENCOUNTER — Encounter: Payer: Self-pay | Admitting: Podiatry

## 2019-01-13 ENCOUNTER — Ambulatory Visit (INDEPENDENT_AMBULATORY_CARE_PROVIDER_SITE_OTHER): Payer: 59

## 2019-01-13 ENCOUNTER — Ambulatory Visit: Payer: 59 | Admitting: Podiatry

## 2019-01-13 ENCOUNTER — Other Ambulatory Visit: Payer: Self-pay

## 2019-01-13 VITALS — BP 137/87

## 2019-01-13 DIAGNOSIS — B351 Tinea unguium: Secondary | ICD-10-CM | POA: Diagnosis not present

## 2019-01-13 DIAGNOSIS — M79671 Pain in right foot: Secondary | ICD-10-CM | POA: Diagnosis not present

## 2019-01-13 DIAGNOSIS — M7731 Calcaneal spur, right foot: Secondary | ICD-10-CM

## 2019-01-13 DIAGNOSIS — M7661 Achilles tendinitis, right leg: Secondary | ICD-10-CM

## 2019-01-14 ENCOUNTER — Encounter: Payer: Self-pay | Admitting: Podiatry

## 2019-01-14 LAB — HEPATIC FUNCTION PANEL
ALT: 46 IU/L — ABNORMAL HIGH (ref 0–44)
AST: 32 IU/L (ref 0–40)
Albumin: 4.7 g/dL (ref 4.0–5.0)
Alkaline Phosphatase: 69 IU/L (ref 39–117)
Bilirubin Total: 0.6 mg/dL (ref 0.0–1.2)
Bilirubin, Direct: 0.25 mg/dL (ref 0.00–0.40)
Total Protein: 6.9 g/dL (ref 6.0–8.5)

## 2019-01-14 MED ORDER — TERBINAFINE HCL 250 MG PO TABS: 250.0000 mg | ORAL_TABLET | Freq: Every day | ORAL | 0 refills | Status: DC

## 2019-01-14 NOTE — Progress Notes (Signed)
Subjective:  Patient ID: Manuel Heath., male    DOB: Feb 17, 1970,  MRN: [id number]  Chief Complaint  Patient presents with  . Foot Pain    pt has foot pain located on the back of the right heel, pain has been going on for about 2 months, pt states that the pain is elevated when walking and painful to the touch    48 y.o. male presents with the above complaint.  Patient presents with right heel pain in the back of it.  Patient states been going on for 2 months.  Patient states it is very painful when it comes on.  At this time patient is only had 2 episodes of acute pain.  Patient primarily works and plays golf.  His last episode was while he was playing [employer] and the next day started hurting.  Patient states he received a prednisone injection but the pain came back recently on Thanksgiving.  Patient states that it is throbbing pain that is painful to touch.  He also has a secondary complaint of bilateral hallux onychomycosis that is been going on for a long period of time.  Patient states that he has tried topical application which has not helped.  He would like to know if there is any other interventions that could be done to help the toenails.   Review of Systems: Negative except as noted in the HPI. Denies N/V/F/Ch.  Past Medical History:  Diagnosis Date  . Hearing loss   . Wears contact lenses     Current Outpatient Medications:  .  ciclopirox (PENLAC) 8 % solution, Apply over nail and surrounding skin daily at bedtime. Apply daily over previous coat. After 7 days, remove w/ alcohol and repeat., Disp: 6.6 mL, Rfl: 3  Social History   Tobacco Use  Smoking Status Never Smoker  Smokeless Tobacco Never Used    No Known Allergies Objective:   Vitals:   01/13/19 1409  BP: 137/87   There is no height or weight on file to calculate BMI. Constitutional Well developed. Well nourished.  Vascular Dorsalis pedis pulses palpable bilaterally. Posterior tibial pulses palpable  bilaterally. Capillary refill normal to all digits.  No cyanosis or clubbing noted. Pedal hair growth normal.  Neurologic Normal speech. Oriented to person, place, and time. Epicritic sensation to light touch grossly present bilaterally.  Dermatologic  thickened elongated mycotic discolored toenail noted of bilateral hallux No open wounds. No skin lesions.  Orthopedic:  Pain on palpation to the right posterior Achilles tendon insertion.  Pain with forced dorsiflexion active and passive.  Pain slightly better with plantarflexion of the foot.  No pain with inversion or eversion of the foot.  No pain at the posterior tibial tendon peroneal tendon ATFL.  There is any quietness present with a positive Silfverskiold's test with gastroc equinus.   Radiographs: 3 views of skeletally mature adult foot reviewed: There is calcification within the tendon noted of the right side.  There is a mild Haglund's deformity noted.  No other bony deformities noted at this time. Assessment:   1. Achilles tendinitis of right lower extremity   2. Pain in right foot   3. Onychomycosis due to dermatophyte    Plan:  Patient was evaluated and treated and all questions answered.  Right Achilles tendinitis with occasional pain -Given that patient is has only 2 episode of this type of pain, I will hold off on steroid injection in the Kager's triangle for now.  I believe that patient is  presenting at the beginning stages of Achilles tendinitis.  I believe patient will benefit from custom-made orthotics with a heel lift and arch support.  Semiflexible pes planus deformity -I explained to the patient the etiology of pes planus deformity and its association with Achilles tendinitis.  Patient will benefit from custom-made orthotics to help addressed and support the arch of his foot as well as heel lift to help take the pressure off the Achilles tendon. -Patient scheduled to see Liliane Channel to have custom-made orthotics  made.  Bilateral hallux onychomycosis -I explained to the patient the etiology of onychomycosis with various treatment options including topical p.o. and laser therapy.  Patient has elected to undergo p.o. therapy with Lamisil.  I will obtain a liver function test if normal I will start the patient on Lamisil therapy.  I will see the patient back in 2 months and if there is no resolve meant in pain I will consider surgical intervention to help resolve the pain.  Return in about 3 months (around 04/13/2019) for See Liliane Channel for orthotics.

## 2019-01-18 ENCOUNTER — Other Ambulatory Visit: Payer: Self-pay | Admitting: Podiatry

## 2019-01-18 DIAGNOSIS — M7661 Achilles tendinitis, right leg: Secondary | ICD-10-CM

## 2019-02-09 ENCOUNTER — Other Ambulatory Visit: Payer: Self-pay

## 2019-02-09 ENCOUNTER — Ambulatory Visit: Payer: 59 | Admitting: Orthotics

## 2019-02-09 DIAGNOSIS — M2142 Flat foot [pes planus] (acquired), left foot: Secondary | ICD-10-CM

## 2019-02-09 DIAGNOSIS — M2141 Flat foot [pes planus] (acquired), right foot: Secondary | ICD-10-CM

## 2019-02-09 DIAGNOSIS — M7661 Achilles tendinitis, right leg: Secondary | ICD-10-CM | POA: Diagnosis not present

## 2019-02-09 DIAGNOSIS — M79671 Pain in right foot: Secondary | ICD-10-CM

## 2019-02-09 NOTE — Progress Notes (Signed)
SAW patietn today for casting CMFO to address achilles tendintis R; plan of f/o with 1/4" lift b/l

## 2019-03-09 ENCOUNTER — Other Ambulatory Visit: Payer: Self-pay

## 2019-03-09 ENCOUNTER — Ambulatory Visit: Payer: 59 | Admitting: Orthotics

## 2019-03-09 DIAGNOSIS — M7661 Achilles tendinitis, right leg: Secondary | ICD-10-CM

## 2019-03-09 DIAGNOSIS — B351 Tinea unguium: Secondary | ICD-10-CM

## 2019-03-09 DIAGNOSIS — M79671 Pain in right foot: Secondary | ICD-10-CM

## 2019-03-09 NOTE — Progress Notes (Signed)
Patient came in today to pick up custom made foot orthotics.  The goals were accomplished and the patient reported no dissatisfaction with said orthotics.  Patient was advised of breakin period and how to report any issues. 

## 2019-04-14 ENCOUNTER — Ambulatory Visit: Payer: 59 | Admitting: Podiatry

## 2019-04-18 ENCOUNTER — Encounter: Payer: Self-pay | Admitting: Podiatry

## 2019-06-23 ENCOUNTER — Telehealth: Payer: Self-pay | Admitting: *Deleted

## 2019-06-23 NOTE — Telephone Encounter (Signed)
I attempted to call the patient to see how he's doing.  I asked him to give me a call if he wanted to schedule a follow-up appointment with Dr. Posey Pronto.

## 2019-10-04 ENCOUNTER — Encounter: Payer: Self-pay | Admitting: Family Medicine

## 2019-10-04 ENCOUNTER — Other Ambulatory Visit: Payer: Self-pay

## 2019-10-04 ENCOUNTER — Ambulatory Visit (INDEPENDENT_AMBULATORY_CARE_PROVIDER_SITE_OTHER): Payer: 59 | Admitting: Family Medicine

## 2019-10-04 VITALS — BP 121/74 | HR 60 | Temp 98.2°F | Ht 67.3 in | Wt 200.8 lb

## 2019-10-04 DIAGNOSIS — Z0000 Encounter for general adult medical examination without abnormal findings: Secondary | ICD-10-CM

## 2019-10-04 LAB — MICROSCOPIC EXAMINATION
Bacteria, UA: NONE SEEN
WBC, UA: NONE SEEN /hpf (ref 0–5)

## 2019-10-04 LAB — URINALYSIS, ROUTINE W REFLEX MICROSCOPIC
Bilirubin, UA: NEGATIVE
Glucose, UA: NEGATIVE
Ketones, UA: NEGATIVE
Leukocytes,UA: NEGATIVE
Nitrite, UA: NEGATIVE
Protein,UA: NEGATIVE
Specific Gravity, UA: 1.02 (ref 1.005–1.030)
Urobilinogen, Ur: 1 mg/dL (ref 0.2–1.0)
pH, UA: 5.5 (ref 5.0–7.5)

## 2019-10-04 MED ORDER — CICLOPIROX 8 % EX SOLN: CUTANEOUS | 3 refills | Status: DC

## 2019-10-04 NOTE — Patient Instructions (Signed)

## 2019-10-04 NOTE — Progress Notes (Signed)
BP 121/74   Pulse 60   Temp 98.2 F (36.8 C) (Oral)   Ht 5' 7.3" (1.709 m)   Wt 200 lb 12.8 oz (91.1 kg)   SpO2 98%   BMI 31.17 kg/m    Subjective:    Patient ID: Manuel Ruffing., male    DOB: August 30, 1970, 49 y.o.   MRN: [id number]  HPI: Manuel Bastyr. is a 49 y.o. male presenting on 10/04/2019 for comprehensive medical examination. Current medical complaints include:none  He currently lives with: wife Interim Problems from his last visit: no  Depression Screen done today and results listed below:  Depression screen [facility] 2/9 10/04/2019 09/28/2018 04/24/2017 11/28/2014  Decreased Interest 0 0 0 0  Down, Depressed, Hopeless 0 0 0 0  PHQ - 2 Score 0 0 0 0  Altered sleeping - 0 0 -  Tired, decreased energy - 0 0 -  Change in appetite - 0 0 -  Feeling bad or failure about yourself  - 0 0 -  Trouble concentrating - 0 0 -  Moving slowly or fidgety/restless - 0 0 -  Suicidal thoughts - 0 0 -  PHQ-9 Score - 0 0 -  Difficult doing work/chores - Not difficult at all - -     Past Medical History:  Past Medical History:  Diagnosis Date  . Hearing loss   . Wears contact lenses     Surgical History:  Past Surgical History:  Procedure Laterality Date  . COLONOSCOPY WITH PROPOFOL N/A 05/18/2017   Procedure: COLONOSCOPY WITH PROPOFOL;  Surgeon: Lucilla Lame, MD;  Location: [place];  Service: Endoscopy;  Laterality: N/A;  . FOREARM FRACTURE SURGERY     x's 2  . POLYPECTOMY  05/18/2017   Procedure: POLYPECTOMY;  Surgeon: Lucilla Lame, MD;  Location: [place];  Service: Endoscopy;;  . SKIN GRAFT     ankle    Medications:  No current outpatient medications on file prior to visit.   No current facility-administered medications on file prior to visit.    Allergies:  No Known Allergies  Social History:  Social History   Socioeconomic History  . Marital status: Married    Spouse name: Not on file  . Number of children: Not on file  . Years of  education: Not on file  . Highest education level: Not on file  Occupational History  . Not on file  Tobacco Use  . Smoking status: Never Smoker  . Smokeless tobacco: Never Used  Vaping Use  . Vaping Use: Never used  Substance and Sexual Activity  . Alcohol use: Yes    Alcohol/week: 3.0 standard drinks    Types: 3 Cans of beer per week    Comment: Socially.  . Drug use: No  . Sexual activity: Yes  Other Topics Concern  . Not on file  Social History Narrative  . Not on file   Social Determinants of Health   Financial Resource Strain:   . Difficulty of Paying Living Expenses: Not on file  Food Insecurity:   . Worried About Charity fundraiser in the Last Year: Not on file  . Ran Out of Food in the Last Year: Not on file  Transportation Needs:   . Lack of Transportation (Medical): Not on file  . Lack of Transportation (Non-Medical): Not on file  Physical Activity:   . Days of Exercise per Week: Not on file  . Minutes of Exercise per Session: Not on file  Stress:   .  Feeling of Stress : Not on file  Social Connections:   . Frequency of Communication with Friends and Family: Not on file  . Frequency of Social Gatherings with Friends and Family: Not on file  . Attends Religious Services: Not on file  . Active Member of Clubs or Organizations: Not on file  . Attends Archivist Meetings: Not on file  . Marital Status: Not on file  Intimate Partner Violence:   . Fear of Current or Ex-Partner: Not on file  . Emotionally Abused: Not on file  . Physically Abused: Not on file  . Sexually Abused: Not on file   Social History   Tobacco Use  Smoking Status Never Smoker  Smokeless Tobacco Never Used   Social History   Substance and Sexual Activity  Alcohol Use Yes  . Alcohol/week: 3.0 standard drinks  . Types: 3 Cans of beer per week   Comment: Socially.    Family History:  Family History  Problem Relation Age of Onset  . Diabetes Mother   . Alzheimer's  disease Mother   . Heart disease Father   . Hypertension Maternal Grandmother   . Alcohol abuse Maternal Grandfather   . Dementia Paternal Grandmother   . Cancer Paternal Grandfather        Lung    Past medical history, surgical history, medications, allergies, family history and social history reviewed with patient today and changes made to appropriate areas of the chart.   Review of Systems  Constitutional: Negative.   HENT: Positive for tinnitus. Negative for congestion, ear discharge, ear pain, hearing loss, nosebleeds, sinus pain and sore throat.   Eyes: Negative.   Respiratory: Negative.  Negative for stridor.   Cardiovascular: Positive for palpitations (2-3 weeks ago for about a week, no pain or SOB). Negative for chest pain, orthopnea, claudication, leg swelling and PND.  Gastrointestinal: Negative.   Genitourinary: Negative.   Musculoskeletal: Negative.   Skin: Negative.   Neurological: Negative.   Endo/Heme/Allergies: Negative.   Psychiatric/Behavioral: Negative.     All other ROS negative except what is listed above and in the HPI.      Objective:    BP 121/74   Pulse 60   Temp 98.2 F (36.8 C) (Oral)   Ht 5' 7.3" (1.709 m)   Wt 200 lb 12.8 oz (91.1 kg)   SpO2 98%   BMI 31.17 kg/m   Wt Readings from Last 3 Encounters:  10/04/19 200 lb 12.8 oz (91.1 kg)  09/28/18 205 lb (93 kg)  05/18/17 198 lb (89.8 kg)    Physical Exam Vitals and nursing note reviewed.  Constitutional:      General: He is not in acute distress.    Appearance: Normal appearance. He is obese. He is not ill-appearing, toxic-appearing or diaphoretic.  HENT:     Head: Normocephalic and atraumatic.     Right Ear: Tympanic membrane, ear canal and external ear normal. There is no impacted cerumen.     Left Ear: Tympanic membrane, ear canal and external ear normal. There is no impacted cerumen.     Nose: Nose normal. No congestion or rhinorrhea.     Mouth/Throat:     Mouth: Mucous membranes  are moist.     Pharynx: Oropharynx is clear. No oropharyngeal exudate or posterior oropharyngeal erythema.  Eyes:     General: No scleral icterus.       Right eye: No discharge.        Left eye: No discharge.  Extraocular Movements: Extraocular movements intact.     Conjunctiva/sclera: Conjunctivae normal.     Pupils: Pupils are equal, round, and reactive to light.  Neck:     Vascular: No carotid bruit.  Cardiovascular:     Rate and Rhythm: Normal rate and regular rhythm.     Pulses: Normal pulses.     Heart sounds: No murmur heard.  No friction rub. No gallop.   Pulmonary:     Effort: Pulmonary effort is normal. No respiratory distress.     Breath sounds: Normal breath sounds. No stridor. No wheezing, rhonchi or rales.  Chest:     Chest wall: No tenderness.  Abdominal:     General: Abdomen is flat. Bowel sounds are normal. There is no distension.     Palpations: Abdomen is soft. There is no mass.     Tenderness: There is no abdominal tenderness. There is no right CVA tenderness, left CVA tenderness, guarding or rebound.     Hernia: No hernia is present.  Genitourinary:    Comments: Genital exam deferred with shared decision making Musculoskeletal:        General: No swelling, tenderness, deformity or signs of injury.     Cervical back: Normal range of motion and neck supple. No rigidity. No muscular tenderness.     Right lower leg: No edema.     Left lower leg: No edema.  Lymphadenopathy:     Cervical: No cervical adenopathy.  Skin:    General: Skin is warm and dry.     Capillary Refill: Capillary refill takes less than 2 seconds.     Coloration: Skin is not jaundiced or pale.     Findings: No bruising, erythema, lesion or rash.  Neurological:     General: No focal deficit present.     Mental Status: He is alert and oriented to person, place, and time.     Cranial Nerves: No cranial nerve deficit.     Sensory: No sensory deficit.     Motor: No weakness.      Coordination: Coordination normal.     Gait: Gait normal.     Deep Tendon Reflexes: Reflexes normal.  Psychiatric:        Mood and Affect: Mood normal.        Behavior: Behavior normal.        Thought Content: Thought content normal.        Judgment: Judgment normal.     Results for orders placed or performed in visit on 01/13/19  Hepatic Function Panel  Result Value Ref Range   Total Protein 6.9 6.0 - 8.5 g/dL   Albumin 4.7 4.0 - 5.0 g/dL   Bilirubin Total 0.6 0.0 - 1.2 mg/dL   Bilirubin, Direct 0.25 0.00 - 0.40 mg/dL   Alkaline Phosphatase 69 39 - 117 IU/L   AST 32 0 - 40 IU/L   ALT 46 (H) 0 - 44 IU/L      Assessment & Plan:   Problem List Items Addressed This Visit    None    Visit Diagnoses    Routine general medical examination at a health care facility    -  Primary   Vaccines up to date. Screening labs checked today. Colonoscopy up to date. Continue diet and exercise. Call with any concerns.    Relevant Orders   CBC with Differential/Platelet   Comprehensive metabolic panel   Lipid Panel w/o Chol/HDL Ratio   PSA   TSH   Urinalysis, Routine w reflex  microscopic   Hepatitis C Antibody       LABORATORY TESTING:  Health maintenance labs ordered today as discussed above.   The natural history of prostate cancer and ongoing controversy regarding screening and potential treatment outcomes of prostate cancer has been discussed with the patient. The meaning of a false positive PSA and a false negative PSA has been discussed. He indicates understanding of the limitations of this screening test and wishes to proceed with screening PSA testing.   IMMUNIZATIONS:   - Tdap: Tetanus vaccination status reviewed: last tetanus booster within 10 years. - Influenza: Postponed to flu season - Pneumovax: Not applicable - COVID: Up to date  SCREENING: - Colonoscopy: Up to date  Discussed with patient purpose of the colonoscopy is to detect colon cancer at curable precancerous  or early stages   PATIENT COUNSELING:    Sexuality: Discussed sexually transmitted diseases, partner selection, use of condoms, avoidance of unintended pregnancy  and contraceptive alternatives.   Advised to avoid cigarette smoking.  I discussed with the patient that most people either abstain from alcohol or drink within safe limits (<=14/week and <=4 drinks/occasion for males, <=7/weeks and <= 3 drinks/occasion for females) and that the risk for alcohol disorders and other health effects rises proportionally with the number of drinks per week and how often a drinker exceeds daily limits.  Discussed cessation/primary prevention of drug use and availability of treatment for abuse.   Diet: Encouraged to adjust caloric intake to maintain  or achieve ideal body weight, to reduce intake of dietary saturated fat and total fat, to limit sodium intake by avoiding high sodium foods and not adding table salt, and to maintain adequate dietary potassium and calcium preferably from fresh fruits, vegetables, and low-fat dairy products.    stressed the importance of regular exercise  Injury prevention: Discussed safety belts, safety helmets, smoke detector, smoking near bedding or upholstery.   Dental health: Discussed importance of regular tooth brushing, flossing, and dental visits.   Follow up plan: NEXT PREVENTATIVE PHYSICAL DUE IN 1 YEAR. Return in about 1 year (around 10/03/2020) for Physical.

## 2019-10-05 LAB — CBC WITH DIFFERENTIAL/PLATELET
Basophils Absolute: 0 10*3/uL (ref 0.0–0.2)
Basos: 1 %
EOS (ABSOLUTE): 0.1 10*3/uL (ref 0.0–0.4)
Eos: 2 %
Hematocrit: 44.2 % (ref 37.5–51.0)
Hemoglobin: 14.3 g/dL (ref 13.0–17.7)
Immature Grans (Abs): 0 10*3/uL (ref 0.0–0.1)
Immature Granulocytes: 0 %
Lymphocytes Absolute: 1.7 10*3/uL (ref 0.7–3.1)
Lymphs: 33 %
MCH: 30.4 pg (ref 26.6–33.0)
MCHC: 32.4 g/dL (ref 31.5–35.7)
MCV: 94 fL (ref 79–97)
Monocytes Absolute: 0.5 10*3/uL (ref 0.1–0.9)
Monocytes: 10 %
Neutrophils Absolute: 2.9 10*3/uL (ref 1.4–7.0)
Neutrophils: 54 %
Platelets: 202 10*3/uL (ref 150–450)
RBC: 4.71 x10E6/uL (ref 4.14–5.80)
RDW: 11.7 % (ref 11.6–15.4)
WBC: 5.4 10*3/uL (ref 3.4–10.8)

## 2019-10-05 LAB — COMPREHENSIVE METABOLIC PANEL WITH GFR
ALT: 18 [IU]/L (ref 0–44)
AST: 18 [IU]/L (ref 0–40)
Albumin/Globulin Ratio: 2 (ref 1.2–2.2)
Albumin: 4.5 g/dL (ref 4.0–5.0)
Alkaline Phosphatase: 67 [IU]/L (ref 48–121)
BUN/Creatinine Ratio: 17 (ref 9–20)
BUN: 18 mg/dL (ref 6–24)
Bilirubin Total: 0.8 mg/dL (ref 0.0–1.2)
CO2: 22 mmol/L (ref 20–29)
Calcium: 9 mg/dL (ref 8.7–10.2)
Chloride: 103 mmol/L (ref 96–106)
Creatinine, Ser: 1.09 mg/dL (ref 0.76–1.27)
GFR calc Af Amer: 91 mL/min/{1.73_m2}
GFR calc non Af Amer: 79 mL/min/{1.73_m2}
Globulin, Total: 2.2 g/dL (ref 1.5–4.5)
Glucose: 107 mg/dL — ABNORMAL HIGH (ref 65–99)
Potassium: 4.2 mmol/L (ref 3.5–5.2)
Sodium: 142 mmol/L (ref 134–144)
Total Protein: 6.7 g/dL (ref 6.0–8.5)

## 2019-10-05 LAB — LIPID PANEL W/O CHOL/HDL RATIO
Cholesterol, Total: 156 mg/dL (ref 100–199)
HDL: 26 mg/dL — ABNORMAL LOW
LDL Chol Calc (NIH): 99 mg/dL (ref 0–99)
Triglycerides: 179 mg/dL — ABNORMAL HIGH (ref 0–149)
VLDL Cholesterol Cal: 31 mg/dL (ref 5–40)

## 2019-10-05 LAB — TSH: TSH: 2.11 u[IU]/mL (ref 0.450–4.500)

## 2019-10-05 LAB — HEPATITIS C ANTIBODY: Hep C Virus Ab: <0.1 {s_co_ratio} (ref 0.0–0.9)

## 2019-10-05 LAB — PSA: Prostate Specific Ag, Serum: 0.9 ng/mL (ref 0.0–4.0)

## 2020-10-05 ENCOUNTER — Encounter: Payer: Self-pay | Admitting: Family Medicine

## 2020-10-05 ENCOUNTER — Other Ambulatory Visit: Payer: Self-pay

## 2020-10-05 ENCOUNTER — Ambulatory Visit (INDEPENDENT_AMBULATORY_CARE_PROVIDER_SITE_OTHER): Payer: 59 | Admitting: Family Medicine

## 2020-10-05 VITALS — BP 120/77 | HR 64 | Temp 98.8°F | Ht 67.28 in | Wt 201.2 lb

## 2020-10-05 DIAGNOSIS — Z23 Encounter for immunization: Secondary | ICD-10-CM | POA: Diagnosis not present

## 2020-10-05 DIAGNOSIS — Z0000 Encounter for general adult medical examination without abnormal findings: Secondary | ICD-10-CM

## 2020-10-05 MED ORDER — CICLOPIROX 8 % EX SOLN: CUTANEOUS | 3 refills | Status: DC

## 2020-10-05 NOTE — Progress Notes (Signed)
BP 120/77   Pulse 64   Temp 98.8 F (37.1 C) (Oral)   Ht 5' 7.28" (1.709 m)   Wt 201 lb 3.2 oz (91.3 kg)   SpO2 98%   BMI 31.25 kg/m    Subjective:    Patient ID: Manuel Ruffing., male    DOB: 1970/04/12, 50 y.o.   MRN: [id number]  HPI: Chief Perreault. is a 49 y.o. male presenting on 10/05/2020 for comprehensive medical examination. Current medical complaints include:none  He currently lives with:wife Interim Problems from his last visit: no  Depression Screen done today and results listed below:  Depression screen [facility] 2/9 10/05/2020 10/04/2019 09/28/2018 04/24/2017 11/28/2014  Decreased Interest 0 0 0 0 0  Down, Depressed, Hopeless 0 0 0 0 0  PHQ - 2 Score 0 0 0 0 0  Altered sleeping 0 - 0 0 -  Tired, decreased energy 0 - 0 0 -  Change in appetite 0 - 0 0 -  Feeling bad or failure about yourself  0 - 0 0 -  Trouble concentrating 0 - 0 0 -  Moving slowly or fidgety/restless 0 - 0 0 -  Suicidal thoughts 0 - 0 0 -  PHQ-9 Score 0 - 0 0 -  Difficult doing work/chores - - Not difficult at all - -     Past Medical History:  Past Medical History:  Diagnosis Date   Hearing loss    Wears contact lenses     Surgical History:  Past Surgical History:  Procedure Laterality Date   COLONOSCOPY WITH PROPOFOL N/A 05/18/2017   Procedure: COLONOSCOPY WITH PROPOFOL;  Surgeon: Lucilla Lame, MD;  Location: [place];  Service: Endoscopy;  Laterality: N/A;   FOREARM FRACTURE SURGERY     x's 2   POLYPECTOMY  05/18/2017   Procedure: POLYPECTOMY;  Surgeon: Lucilla Lame, MD;  Location: [place];  Service: Endoscopy;;   SKIN GRAFT     ankle    Medications:  No current outpatient medications on file prior to visit.   No current facility-administered medications on file prior to visit.    Allergies:  No Known Allergies  Social History:  Social History   Socioeconomic History   Marital status: Married    Spouse name: Not on file   Number of children: Not on  file   Years of education: Not on file   Highest education level: Not on file  Occupational History   Not on file  Tobacco Use   Smoking status: Never   Smokeless tobacco: Never  Vaping Use   Vaping Use: Never used  Substance and Sexual Activity   Alcohol use: Yes    Alcohol/week: 3.0 standard drinks    Types: 3 Cans of beer per week    Comment: Socially.   Drug use: No   Sexual activity: Yes  Other Topics Concern   Not on file  Social History Narrative   Not on file   Social Determinants of Health   Financial Resource Strain: Not on file  Food Insecurity: Not on file  Transportation Needs: Not on file  Physical Activity: Not on file  Stress: Not on file  Social Connections: Not on file  Intimate Partner Violence: Not on file   Social History   Tobacco Use  Smoking Status Never  Smokeless Tobacco Never   Social History   Substance and Sexual Activity  Alcohol Use Yes   Alcohol/week: 3.0 standard drinks   Types: 3 Cans of  beer per week   Comment: Socially.    Family History:  Family History  Problem Relation Age of Onset   Diabetes Mother    Alzheimer's disease Mother    Heart disease Father    Hypertension Maternal Grandmother    Alcohol abuse Maternal Grandfather    Dementia Paternal Grandmother    Cancer Paternal Grandfather        Lung    Past medical history, surgical history, medications, allergies, family history and social history reviewed with patient today and changes made to appropriate areas of the chart.   Review of Systems  Constitutional: Negative.   HENT:  Positive for tinnitus. Negative for congestion, ear discharge, ear pain, hearing loss, nosebleeds, sinus pain and sore throat.   Eyes: Negative.   Respiratory: Negative.  Negative for stridor.   Cardiovascular: Negative.   Gastrointestinal: Negative.   Genitourinary: Negative.   Musculoskeletal: Negative.   Skin: Negative.   Neurological: Negative.   Endo/Heme/Allergies:  Negative.   Psychiatric/Behavioral: Negative.    All other ROS negative except what is listed above and in the HPI.      Objective:    BP 120/77   Pulse 64   Temp 98.8 F (37.1 C) (Oral)   Ht 5' 7.28" (1.709 m)   Wt 201 lb 3.2 oz (91.3 kg)   SpO2 98%   BMI 31.25 kg/m   Wt Readings from Last 3 Encounters:  10/05/20 201 lb 3.2 oz (91.3 kg)  10/04/19 200 lb 12.8 oz (91.1 kg)  09/28/18 205 lb (93 kg)    Physical Exam Vitals and nursing note reviewed.  Constitutional:      General: He is not in acute distress.    Appearance: Normal appearance. He is obese. He is not ill-appearing, toxic-appearing or diaphoretic.  HENT:     Head: Normocephalic and atraumatic.     Right Ear: Tympanic membrane, ear canal and external ear normal. There is no impacted cerumen.     Left Ear: Tympanic membrane, ear canal and external ear normal. There is no impacted cerumen.     Nose: Nose normal. No congestion or rhinorrhea.     Mouth/Throat:     Mouth: Mucous membranes are moist.     Pharynx: Oropharynx is clear. No oropharyngeal exudate or posterior oropharyngeal erythema.  Eyes:     General: No scleral icterus.       Right eye: No discharge.        Left eye: No discharge.     Extraocular Movements: Extraocular movements intact.     Conjunctiva/sclera: Conjunctivae normal.     Pupils: Pupils are equal, round, and reactive to light.  Neck:     Vascular: No carotid bruit.  Cardiovascular:     Rate and Rhythm: Normal rate and regular rhythm.     Pulses: Normal pulses.     Heart sounds: No murmur heard.   No friction rub. No gallop.  Pulmonary:     Effort: Pulmonary effort is normal. No respiratory distress.     Breath sounds: Normal breath sounds. No stridor. No wheezing, rhonchi or rales.  Chest:     Chest wall: No tenderness.  Abdominal:     General: Abdomen is flat. Bowel sounds are normal. There is no distension.     Palpations: Abdomen is soft. There is no mass.     Tenderness: There  is no abdominal tenderness. There is no right CVA tenderness, left CVA tenderness, guarding or rebound.     Hernia:  No hernia is present.  Genitourinary:    Comments: Genital exam deferred with shared decision making Musculoskeletal:        General: No swelling, tenderness, deformity or signs of injury.     Cervical back: Normal range of motion and neck supple. No rigidity. No muscular tenderness.     Right lower leg: No edema.     Left lower leg: No edema.  Lymphadenopathy:     Cervical: No cervical adenopathy.  Skin:    General: Skin is warm and dry.     Capillary Refill: Capillary refill takes less than 2 seconds.     Coloration: Skin is not jaundiced or pale.     Findings: No bruising, erythema, lesion or rash.  Neurological:     General: No focal deficit present.     Mental Status: He is alert and oriented to person, place, and time.     Cranial Nerves: No cranial nerve deficit.     Sensory: No sensory deficit.     Motor: No weakness.     Coordination: Coordination normal.     Gait: Gait normal.     Deep Tendon Reflexes: Reflexes normal.  Psychiatric:        Mood and Affect: Mood normal.        Behavior: Behavior normal.        Thought Content: Thought content normal.        Judgment: Judgment normal.    Results for orders placed or performed in visit on 10/04/19  Microscopic Examination   Urine  Result Value Ref Range   WBC, UA None seen 0 - 5 /hpf   RBC 0-2 0 - 2 /hpf   Epithelial Cells (non renal) 0-10 0 - 10 /hpf   Bacteria, UA None seen None seen/Few  CBC with Differential/Platelet  Result Value Ref Range   WBC 5.4 3.4 - 10.8 x10E3/uL   RBC 4.71 4.14 - 5.80 x10E6/uL   Hemoglobin 14.3 13.0 - 17.7 g/dL   Hematocrit 44.2 37.5 - 51.0 %   MCV 94 79 - 97 fL   MCH 30.4 26.6 - 33.0 pg   MCHC 32.4 31.5 - 35.7 g/dL   RDW 11.7 11.6 - 15.4 %   Platelets 202 150 - 450 x10E3/uL   Neutrophils 54 Not Estab. %   Lymphs 33 Not Estab. %   Monocytes 10 Not Estab. %   Eos 2  Not Estab. %   Basos 1 Not Estab. %   Neutrophils Absolute 2.9 1.4 - 7.0 x10E3/uL   Lymphocytes Absolute 1.7 0.7 - 3.1 x10E3/uL   Monocytes Absolute 0.5 0.1 - 0.9 x10E3/uL   EOS (ABSOLUTE) 0.1 0.0 - 0.4 x10E3/uL   Basophils Absolute 0.0 0.0 - 0.2 x10E3/uL   Immature Granulocytes 0 Not Estab. %   Immature Grans (Abs) 0.0 0.0 - 0.1 x10E3/uL  Comprehensive metabolic panel  Result Value Ref Range   Glucose 107 (H) 65 - 99 mg/dL   BUN 18 6 - 24 mg/dL   Creatinine, Ser 1.09 0.76 - 1.27 mg/dL   GFR calc non Af Amer 79 >59 mL/min/1.73   GFR calc Af Amer 91 >59 mL/min/1.73   BUN/Creatinine Ratio 17 9 - 20   Sodium 142 134 - 144 mmol/L   Potassium 4.2 3.5 - 5.2 mmol/L   Chloride 103 96 - 106 mmol/L   CO2 22 20 - 29 mmol/L   Calcium 9.0 8.7 - 10.2 mg/dL   Total Protein 6.7 6.0 - 8.5 g/dL  Albumin 4.5 4.0 - 5.0 g/dL   Globulin, Total 2.2 1.5 - 4.5 g/dL   Albumin/Globulin Ratio 2.0 1.2 - 2.2   Bilirubin Total 0.8 0.0 - 1.2 mg/dL   Alkaline Phosphatase 67 48 - 121 IU/L   AST 18 0 - 40 IU/L   ALT 18 0 - 44 IU/L  Lipid Panel w/o Chol/HDL Ratio  Result Value Ref Range   Cholesterol, Total 156 100 - 199 mg/dL   Triglycerides 179 (H) 0 - 149 mg/dL   HDL 26 (L) >39 mg/dL   VLDL Cholesterol Cal 31 5 - 40 mg/dL   LDL Chol Calc (NIH) 99 0 - 99 mg/dL  PSA  Result Value Ref Range   Prostate Specific Ag, Serum 0.9 0.0 - 4.0 ng/mL  TSH  Result Value Ref Range   TSH 2.110 0.450 - 4.500 uIU/mL  Urinalysis, Routine w reflex microscopic  Result Value Ref Range   Specific Gravity, UA 1.020 1.005 - 1.030   pH, UA 5.5 5.0 - 7.5   Color, UA Yellow Yellow   Appearance Ur Clear Clear   Leukocytes,UA Negative Negative   Protein,UA Negative Negative/Trace   Glucose, UA Negative Negative   Ketones, UA Negative Negative   RBC, UA Trace (A) Negative   Bilirubin, UA Negative Negative   Urobilinogen, Ur 1.0 0.2 - 1.0 mg/dL   Nitrite, UA Negative Negative   Microscopic Examination See below:    Hepatitis C Antibody  Result Value Ref Range   Hep C Virus Ab <0.1 0.0 - 0.9 s/co ratio      Assessment & Plan:   Problem List Items Addressed This Visit   None Visit Diagnoses     Routine general medical examination at a health care facility    -  Primary   Vaccines up to date. Screening labs checked today. Colonoscopy up to date. Continue diet and exercise. Call with any concerns.   Relevant Orders   Comprehensive metabolic panel   CBC with Differential/Platelet   Lipid Panel w/o Chol/HDL Ratio   PSA   TSH   Urinalysis, Routine w reflex microscopic        Discussed aspirin prophylaxis for myocardial infarction prevention and decision was it was not indicated  LABORATORY TESTING:  Health maintenance labs ordered today as discussed above.   The natural history of prostate cancer and ongoing controversy regarding screening and potential treatment outcomes of prostate cancer has been discussed with the patient. The meaning of a false positive PSA and a false negative PSA has been discussed. He indicates understanding of the limitations of this screening test and wishes  to proceed with screening PSA testing.   IMMUNIZATIONS:   - Tdap: Tetanus vaccination status reviewed: Tdap vaccination indicated and given today. - Influenza: Postponed to flu season - Pneumovax: Not applicable - Prevnar: Not applicable - COVID: Up to date - Shingrix vaccine:  will check with insurance  SCREENING: - Colonoscopy: Up to date  Discussed with patient purpose of the colonoscopy is to detect colon cancer at curable precancerous or early stages    PATIENT COUNSELING:    Sexuality: Discussed sexually transmitted diseases, partner selection, use of condoms, avoidance of unintended pregnancy  and contraceptive alternatives.   Advised to avoid cigarette smoking.  I discussed with the patient that most people either abstain from alcohol or drink within safe limits (<=14/week and <=4  drinks/occasion for males, <=7/weeks and <= 3 drinks/occasion for females) and that the risk for alcohol disorders and other health  effects rises proportionally with the number of drinks per week and how often a drinker exceeds daily limits.  Discussed cessation/primary prevention of drug use and availability of treatment for abuse.   Diet: Encouraged to adjust caloric intake to maintain  or achieve ideal body weight, to reduce intake of dietary saturated fat and total fat, to limit sodium intake by avoiding high sodium foods and not adding table salt, and to maintain adequate dietary potassium and calcium preferably from fresh fruits, vegetables, and low-fat dairy products.    stressed the importance of regular exercise  Injury prevention: Discussed safety belts, safety helmets, smoke detector, smoking near bedding or upholstery.   Dental health: Discussed importance of regular tooth brushing, flossing, and dental visits.   Follow up plan: NEXT PREVENTATIVE PHYSICAL DUE IN 1 YEAR. Return in about 1 year (around 10/05/2021), or physical.

## 2020-10-06 LAB — CBC WITH DIFFERENTIAL/PLATELET
Basophils Absolute: 0 10*3/uL (ref 0.0–0.2)
Basos: 1 %
EOS (ABSOLUTE): 0.2 10*3/uL (ref 0.0–0.4)
Eos: 2 %
Hematocrit: 45.4 % (ref 37.5–51.0)
Hemoglobin: 15.5 g/dL (ref 13.0–17.7)
Immature Grans (Abs): 0 10*3/uL (ref 0.0–0.1)
Immature Granulocytes: 0 %
Lymphocytes Absolute: 2 10*3/uL (ref 0.7–3.1)
Lymphs: 32 %
MCH: 31.3 pg (ref 26.6–33.0)
MCHC: 34.1 g/dL (ref 31.5–35.7)
MCV: 92 fL (ref 79–97)
Monocytes Absolute: 0.5 10*3/uL (ref 0.1–0.9)
Monocytes: 8 %
Neutrophils Absolute: 3.5 10*3/uL (ref 1.4–7.0)
Neutrophils: 57 %
Platelets: 215 10*3/uL (ref 150–450)
RBC: 4.95 x10E6/uL (ref 4.14–5.80)
RDW: 11.7 % (ref 11.6–15.4)
WBC: 6.2 10*3/uL (ref 3.4–10.8)

## 2020-10-06 LAB — LIPID PANEL W/O CHOL/HDL RATIO
Cholesterol, Total: 199 mg/dL (ref 100–199)
HDL: 31 mg/dL — ABNORMAL LOW (ref >39)
LDL Chol Calc (NIH): 143 mg/dL — ABNORMAL HIGH (ref 0–99)
Triglycerides: 138 mg/dL (ref 0–149)
VLDL Cholesterol Cal: 25 mg/dL (ref 5–40)

## 2020-10-06 LAB — COMPREHENSIVE METABOLIC PANEL
ALT: 22 IU/L (ref 0–44)
AST: 20 IU/L (ref 0–40)
Albumin/Globulin Ratio: 2 (ref 1.2–2.2)
Albumin: 4.9 g/dL (ref 3.8–4.9)
Alkaline Phosphatase: 63 IU/L (ref 44–121)
BUN/Creatinine Ratio: 19 (ref 9–20)
BUN: 22 mg/dL (ref 6–24)
Bilirubin Total: 1 mg/dL (ref 0.0–1.2)
CO2: 21 mmol/L (ref 20–29)
Calcium: 9.6 mg/dL (ref 8.7–10.2)
Chloride: 103 mmol/L (ref 96–106)
Creatinine, Ser: 1.18 mg/dL (ref 0.76–1.27)
Globulin, Total: 2.4 g/dL (ref 1.5–4.5)
Glucose: 108 mg/dL — ABNORMAL HIGH (ref 65–99)
Potassium: 4.2 mmol/L (ref 3.5–5.2)
Sodium: 140 mmol/L (ref 134–144)
Total Protein: 7.3 g/dL (ref 6.0–8.5)
eGFR: 75 mL/min/{1.73_m2} (ref >59)

## 2020-10-06 LAB — TSH: TSH: 1.92 u[IU]/mL (ref 0.450–4.500)

## 2020-10-06 LAB — PSA: Prostate Specific Ag, Serum: 1 ng/mL (ref 0.0–4.0)

## 2020-10-09 LAB — URINALYSIS, ROUTINE W REFLEX MICROSCOPIC
Bilirubin, UA: NEGATIVE
Glucose, UA: NEGATIVE
Ketones, UA: NEGATIVE
Leukocytes,UA: NEGATIVE
Nitrite, UA: NEGATIVE
Protein,UA: NEGATIVE
RBC, UA: NEGATIVE
Specific Gravity, UA: 1.025 (ref 1.005–1.030)
Urobilinogen, Ur: 0.2 mg/dL (ref 0.2–1.0)
pH, UA: 5 (ref 5.0–7.5)

## 2020-11-05 ENCOUNTER — Encounter: Payer: 59 | Admitting: Family Medicine

## 2021-08-28 ENCOUNTER — Ambulatory Visit
Admission: EM | Admit: 2021-08-28 | Discharge: 2021-08-28 | Disposition: A | Discharge status: Home / Self Care | Payer: 59 | Attending: Emergency Medicine | Admitting: Emergency Medicine

## 2021-08-28 DIAGNOSIS — Z20822 Contact with and (suspected) exposure to covid-19: Secondary | ICD-10-CM | POA: Insufficient documentation

## 2021-08-28 DIAGNOSIS — D72829 Elevated white blood cell count, unspecified: Secondary | ICD-10-CM

## 2021-08-28 DIAGNOSIS — R1031 Right lower quadrant pain: Secondary | ICD-10-CM

## 2021-08-28 HISTORY — PX: APPENDECTOMY: SHX54

## 2021-08-28 LAB — CBC WITH DIFFERENTIAL/PLATELET
Abs Immature Granulocytes: 0.06 K/uL (ref 0.00–0.07)
Basophils Absolute: 0 K/uL (ref 0.0–0.1)
Basophils Relative: 0 %
Eosinophils Absolute: 0 K/uL (ref 0.0–0.5)
Eosinophils Relative: 0 %
HCT: 42.6 % (ref 39.0–52.0)
Hemoglobin: 14.5 g/dL (ref 13.0–17.0)
Immature Granulocytes: 0 %
Lymphocytes Relative: 9 %
Lymphs Abs: 1.6 K/uL (ref 0.7–4.0)
MCH: 30.9 pg (ref 26.0–34.0)
MCHC: 34 g/dL (ref 30.0–36.0)
MCV: 90.8 fL (ref 80.0–100.0)
Monocytes Absolute: 1.2 K/uL — ABNORMAL HIGH (ref 0.1–1.0)
Monocytes Relative: 7 %
Neutro Abs: 15.6 K/uL — ABNORMAL HIGH (ref 1.7–7.7)
Neutrophils Relative %: 84 %
Platelets: 225 K/uL (ref 150–400)
RBC: 4.69 MIL/uL (ref 4.22–5.81)
RDW: 12.2 % (ref 11.5–15.5)
WBC: 18.5 K/uL — ABNORMAL HIGH (ref 4.0–10.5)
nRBC: 0 % (ref 0.0–0.2)

## 2021-08-28 LAB — COMPREHENSIVE METABOLIC PANEL WITH GFR
ALT: 23 U/L (ref 0–44)
AST: 26 U/L (ref 15–41)
Albumin: 5 g/dL (ref 3.5–5.0)
Alkaline Phosphatase: 50 U/L (ref 38–126)
Anion gap: 9 (ref 5–15)
BUN: 18 mg/dL (ref 6–20)
CO2: 25 mmol/L (ref 22–32)
Calcium: 9.5 mg/dL (ref 8.9–10.3)
Chloride: 104 mmol/L (ref 98–111)
Creatinine, Ser: 1.08 mg/dL (ref 0.61–1.24)
GFR, Estimated: >60 mL/min
Glucose, Bld: 149 mg/dL — ABNORMAL HIGH (ref 70–99)
Potassium: 4.9 mmol/L (ref 3.5–5.1)
Sodium: 138 mmol/L (ref 135–145)
Total Bilirubin: 1.4 mg/dL — ABNORMAL HIGH (ref 0.3–1.2)
Total Protein: 8.1 g/dL (ref 6.5–8.1)

## 2021-08-28 LAB — SARS CORONAVIRUS 2 BY RT PCR: SARS Coronavirus 2 by RT PCR: NEGATIVE

## 2021-08-28 LAB — URINALYSIS, ROUTINE W REFLEX MICROSCOPIC
Bilirubin Urine: NEGATIVE
Glucose, UA: 100 mg/dL — AB
Hgb urine dipstick: NEGATIVE
Leukocytes,Ua: NEGATIVE
Nitrite: NEGATIVE
Specific Gravity, Urine: 1.025 (ref 1.005–1.030)
pH: 6 (ref 5.0–8.0)

## 2021-08-28 LAB — URINALYSIS, MICROSCOPIC (REFLEX)
RBC / HPF: NONE SEEN RBC/hpf (ref 0–5)
WBC, UA: NONE SEEN WBC/hpf (ref 0–5)

## 2021-08-28 LAB — LIPASE, BLOOD: Lipase: 27 U/L (ref 11–51)

## 2021-08-28 MED ORDER — ONDANSETRON 8 MG PO TBDP
8.0000 mg | ORAL_TABLET | Freq: Once | ORAL | Status: AC
  Administered 2021-08-28: 8 mg via ORAL

## 2021-08-28 NOTE — ED Triage Notes (Signed)
Pt present abdomen pain with vomiting, symptom started yesterday. Pt states that his stomach pain is all over and he feels bloated.

## 2021-08-28 NOTE — ED Provider Notes (Signed)
HPI  SUBJECTIVE:  Manuel Heath. is a 51 y.o. male who presents with "feeling full" yesterday.  He woke up with diffuse, achy abdominal pain described as soreness last night.  He reports 3-4 episodes of nonbilious, nonbloody emesis, states that he feels bloated.  He reports anorexia.  He had a normal bowel movement this morning, without any change in his pain.  No melena, hematochezia.  This pain does not radiate to his back.  No chest pain, pressure, heaviness, shortness of breath, abdominal distention, change in urine output, nasal congestion, rhinorrhea, body aches, headaches, loss of sense of smell or taste, cough, wheeze, shortness of breath, diarrhea.  No known exposure to COVID.  He had 3-4 doses of the COVID-vaccine.  He has tried Alka-Seltzer, ginger ale.  Vomiting makes him feel better.  No aggravating factors.  His pain is not aggravated with urination, movement, p.o. intake, vomiting stooling.  The car ride over here was not painful.  No alcohol last night.  No excess NSAID intake.  No raw or undercooked foods, questionable leftovers, recent travel or sick contacts.  No recent antibiotics.  No urinary complaints, penile rash, discharge, testicular pain or swelling.  He had COVID in 2020.  Past medical history negative for diabetes, hypertension, hypercholesterolemia, atrial fibrillation, hypercoagulability, mesenteric ischemia, peptic ulcer disease, H. pylori infection, abdominal surgeries, aortic abdominal aneurysm, diverticulitis, gallbladder disease, pancreatitis.  PCP: [facility].   Past Medical History:  Diagnosis Date   Hearing loss    Wears contact lenses     Past Surgical History:  Procedure Laterality Date   COLONOSCOPY WITH PROPOFOL N/A 05/18/2017   Procedure: COLONOSCOPY WITH PROPOFOL;  Surgeon: Lucilla Lame, MD;  Location: [place];  Service: Endoscopy;  Laterality: N/A;   FOREARM FRACTURE SURGERY     x's 2   POLYPECTOMY  05/18/2017   Procedure:  POLYPECTOMY;  Surgeon: Lucilla Lame, MD;  Location: [place];  Service: Endoscopy;;   SKIN GRAFT     ankle    Family History  Problem Relation Age of Onset   Diabetes Mother    Alzheimer's disease Mother    Heart disease Father    Hypertension Maternal Grandmother    Alcohol abuse Maternal Grandfather    Dementia Paternal Grandmother    Cancer Paternal Grandfather        Lung    Social History   Tobacco Use   Smoking status: Never   Smokeless tobacco: Never  Vaping Use   Vaping Use: Never used  Substance Use Topics   Alcohol use: Yes    Alcohol/week: 3.0 standard drinks of alcohol    Types: 3 Cans of beer per week    Comment: Socially.   Drug use: No    No current facility-administered medications for this encounter.  Current Outpatient Medications:    ciclopirox (PENLAC) 8 % solution, Apply over nail and surrounding skin daily at bedtime. Apply daily over previous coat. After 7 days, remove w/ alcohol and repeat., Disp: 6.6 mL, Rfl: 3  No Known Allergies   ROS  As noted in HPI.   Physical Exam  BP 138/83 (BP Location: Left Arm)   Pulse 64   Temp 98.9 F (37.2 C) (Oral)   Resp 18   SpO2 99%   Constitutional: Well developed, well nourished, no acute distress.  Moving around the room comfortably. Eyes: PERRL, EOMI, conjunctiva normal bilaterally HENT: Normocephalic, atraumatic,mucus membranes moist Respiratory: Clear to auscultation bilaterally, no rales, no wheezing, no rhonchi  Cardiovascular: Normal rate and rhythm, no murmurs, no gallops, no rubs GI: Normal appearance, soft, nondistended, normal bowel sounds, very mild right lower quadrant tenderness, no rebound, no guarding.  Negative Murphy.  Negative Rovsing.  Negative tap table test. Back: no CVAT skin: No rash, skin intact Musculoskeletal: No deformity Neurologic: Alert & oriented x 3, CN III-XII grossly intact, no motor deficits, sensation grossly intact Psychiatric: Speech and behavior  appropriate   ED Course   Medications  ondansetron (ZOFRAN-ODT) disintegrating tablet 8 mg (8 mg Oral Given 08/28/21 1005)    Orders Placed This Encounter  Procedures   SARS Coronavirus 2 by RT PCR (hospital order, performed in [place] hospital lab) *cepheid single result test* Anterior Nasal Swab    Standing Status:   Standing    Number of Occurrences:   1   CBC with Differential    Standing Status:   Standing    Number of Occurrences:   1   Comprehensive metabolic panel    Standing Status:   Standing    Number of Occurrences:   1   Lipase, blood    Standing Status:   Standing    Number of Occurrences:   1   Urinalysis, Routine w reflex microscopic Urine, Clean Catch    Standing Status:   Standing    Number of Occurrences:   1   Urinalysis, Microscopic (reflex)    Standing Status:   Standing    Number of Occurrences:   1   Offer Fluids    Standing Status:   Standing    Number of Occurrences:   20   ED EKG    Epigastric pain    Standing Status:   Standing    Number of Occurrences:   1    Order Specific Question:   Reason for Exam    Answer:   Other (See Comments)   EKG 12-Lead    Standing Status:   Standing    Number of Occurrences:   1   Results for orders placed or performed during the hospital encounter of 08/28/21 (from the past 24 hour(s))  CBC with Differential     Status: Abnormal   Collection Time: 08/28/21 10:07 AM  Result Value Ref Range   WBC 18.5 (H) 4.0 - 10.5 K/uL   RBC 4.69 4.22 - 5.81 MIL/uL   Hemoglobin 14.5 13.0 - 17.0 g/dL   HCT 42.6 39.0 - 52.0 %   MCV 90.8 80.0 - 100.0 fL   MCH 30.9 26.0 - 34.0 pg   MCHC 34.0 30.0 - 36.0 g/dL   RDW 12.2 11.5 - 15.5 %   Platelets 225 150 - 400 K/uL   nRBC 0.0 0.0 - 0.2 %   Neutrophils Relative % 84 %   Neutro Abs 15.6 (H) 1.7 - 7.7 K/uL   Lymphocytes Relative 9 %   Lymphs Abs 1.6 0.7 - 4.0 K/uL   Monocytes Relative 7 %   Monocytes Absolute 1.2 (H) 0.1 - 1.0 K/uL   Eosinophils Relative 0 %    Eosinophils Absolute 0.0 0.0 - 0.5 K/uL   Basophils Relative 0 %   Basophils Absolute 0.0 0.0 - 0.1 K/uL   Immature Granulocytes 0 %   Abs Immature Granulocytes 0.06 0.00 - 0.07 K/uL  Comprehensive metabolic panel     Status: Abnormal   Collection Time: 08/28/21 10:07 AM  Result Value Ref Range   Sodium 138 135 - 145 mmol/L   Potassium 4.9 3.5 - 5.1 mmol/L  Chloride 104 98 - 111 mmol/L   CO2 25 22 - 32 mmol/L   Glucose, Bld 149 (H) 70 - 99 mg/dL   BUN 18 6 - 20 mg/dL   Creatinine, Ser 1.08 0.61 - 1.24 mg/dL   Calcium 9.5 8.9 - 10.3 mg/dL   Total Protein 8.1 6.5 - 8.1 g/dL   Albumin 5.0 3.5 - 5.0 g/dL   AST 26 15 - 41 U/L   ALT 23 0 - 44 U/L   Alkaline Phosphatase 50 38 - 126 U/L   Total Bilirubin 1.4 (H) 0.3 - 1.2 mg/dL   GFR, Estimated >60 >60 mL/min   Anion gap 9 5 - 15  Lipase, blood     Status: None   Collection Time: 08/28/21 10:07 AM  Result Value Ref Range   Lipase 27 11 - 51 U/L  Urinalysis, Routine w reflex microscopic Urine, Clean Catch     Status: Abnormal   Collection Time: 08/28/21 10:07 AM  Result Value Ref Range   Color, Urine YELLOW YELLOW   APPearance CLEAR CLEAR   Specific Gravity, Urine 1.025 1.005 - 1.030   pH 6.0 5.0 - 8.0   Glucose, UA 100 (A) NEGATIVE mg/dL   Hgb urine dipstick NEGATIVE NEGATIVE   Bilirubin Urine NEGATIVE NEGATIVE   Ketones, ur TRACE (A) NEGATIVE mg/dL   Protein, ur TRACE (A) NEGATIVE mg/dL   Nitrite NEGATIVE NEGATIVE   Leukocytes,Ua NEGATIVE NEGATIVE  Urinalysis, Microscopic (reflex)     Status: Abnormal   Collection Time: 08/28/21 10:07 AM  Result Value Ref Range   RBC / HPF NONE SEEN 0 - 5 RBC/hpf   WBC, UA NONE SEEN 0 - 5 WBC/hpf   Bacteria, UA RARE (A) NONE SEEN   Squamous Epithelial / LPF 0-5 0 - 5  SARS Coronavirus 2 by RT PCR (hospital order, performed in [place] hospital lab) *cepheid single result test* Urine, Clean Catch     Status: None   Collection Time: 08/28/21 10:08 AM   Specimen: Urine, Clean Catch; Nasal  Swab  Result Value Ref Range   SARS Coronavirus 2 by RT PCR NEGATIVE NEGATIVE   No results found.  ED Clinical Impression  1. Right lower quadrant abdominal pain   2. Leukocytosis, unspecified type      ED Assessment/Plan   EKG: Normal sinus rhythm, rate 71.  Normal axis, normal intervals.  No hypertrophy.  No ST-T wave changes.  No previous EKG for comparison.  Patient symptomatic while EKG was obtained.  Patient has some right lower quadrant tenderness with palpation.  He does not have any peritoneal signs at this time.  His vitals are normal, although he did take an antipyretic within 6 hours of evaluation.  Doubt ACS, as his EKG was normal and he was symptomatic while it was obtained.  Doubt aortic abdominal aneurysm, perforation, obstruction.  Could be diverticulitis versus appendicitis.  Doubt GU cause.  No CT available here today.  Gave the patient the option of doing a limited work-up here, with him returning here or seeing his doctor in 12 to 24 hours for repeat abdominal exam if everything is normal with the understanding that if anything changes or gets worse, he will go immediately to the emergency department, versus going to the ED for comprehensive work-up.  Patient has opted to start work-up here.  Giving 8 mg of Zofran.  Will reevaluate  CBC, CMP, UA, lipase, COVID.  COVID-negative.  He has rare bacteria, dysuria, trace ketones and protein, but  no nitrites, esterase.  He has no urinary complaints.  Doubt UTI.  He is CMP has slightly elevated total bilirubin, however, his CBC has a leukocytosis at 18.5 with a left shift.  Given combination of anorexia, vomiting, leukocytosis and right lower quadrant pain, I am concerned for appendicitis or diverticulitis.  Transferring to the emergency department for further evaluation.  Discussed rationale for transfer to the emergency department with the patient.  He agrees to go to [employer]- His wife works at [employer].  He is stable  to go by private vehicle.  Meds ordered this encounter  Medications   ondansetron (ZOFRAN-ODT) disintegrating tablet 8 mg      *This clinic note was created using [employer]. Therefore, there may be occasional mistakes despite careful proofreading. ?    Melynda Ripple, MD 08/28/21 1055

## 2021-08-28 NOTE — ED Notes (Signed)
Patient is being discharged from the Urgent Care and sent to the Emergency Department via POV . Per Dr. Alphonzo Cruise, patient is in need of higher level of care due to RLQ pain, and leukocytosis. Patient is aware and verbalizes understanding of plan of care.  Vitals:   08/28/21 0913  BP: 138/83  Pulse: 64  Resp: 18  Temp: 98.9 F (37.2 C)  SpO2: 99%

## 2021-08-28 NOTE — Discharge Instructions (Addendum)
Please go to the emergency department right now.  Do not have anything to eat or drink until your ER evaluation is complete.  I am concerned that you have appendicitis given your physical exam, lab findings of leukocytosis of 18.5 with a white count.  Let them know if your pain changes or gets worse.

## 2021-08-30 IMAGING — CR DG ANKLE COMPLETE 3+V*R*
3 series · 3 of 3 positions shown · non-contrast
Comparison: None.

CLINICAL DATA: Achilles pain

EXAM:
RIGHT ANKLE - COMPLETE 3+ VIEW

[ankle ap]
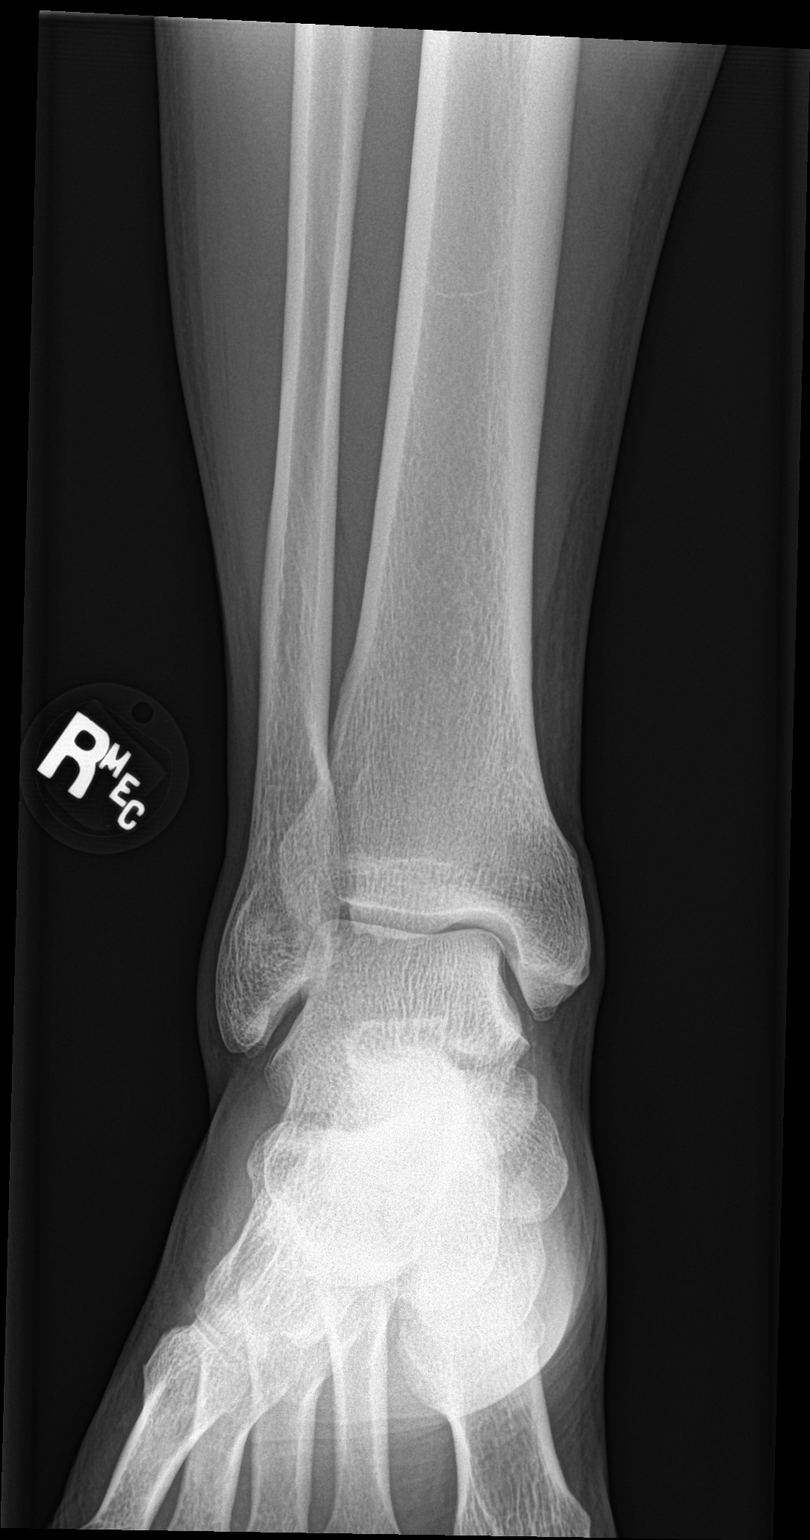

[ankle obl]
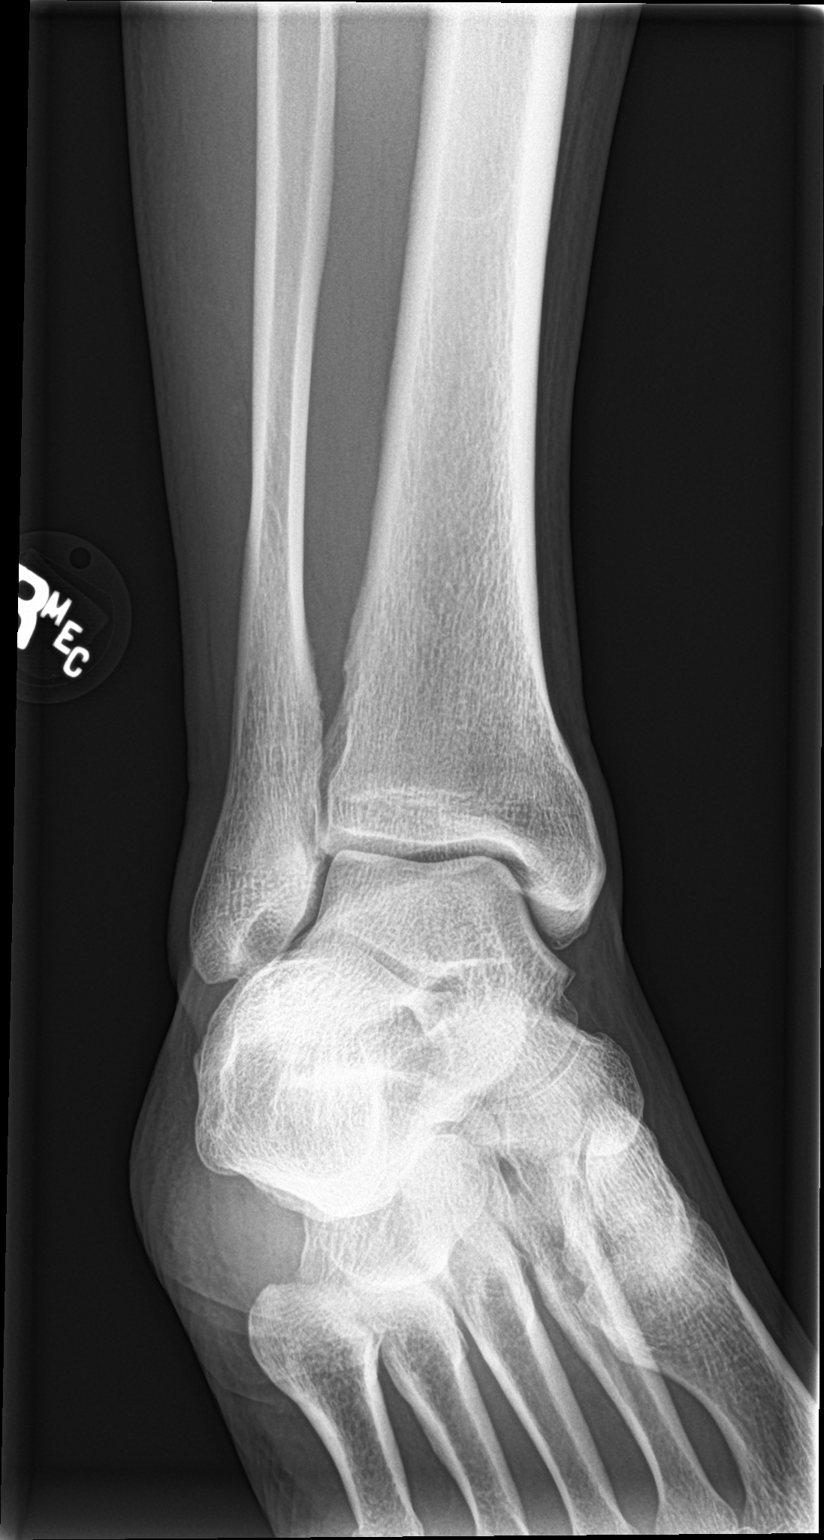

[ankle lat]
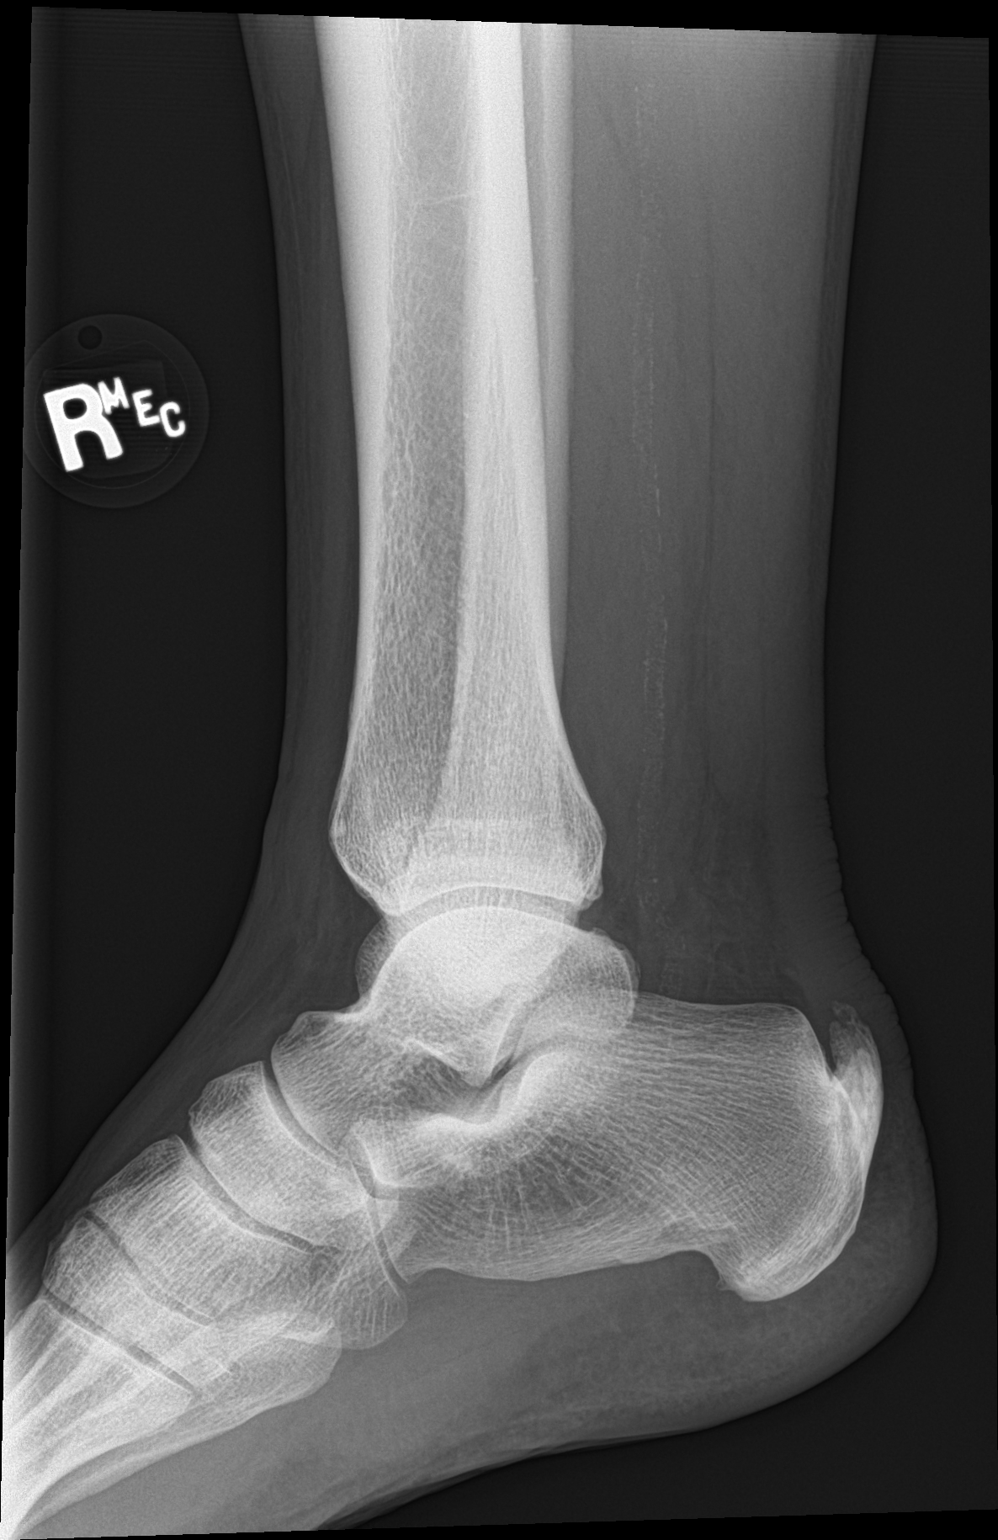

[3 of 3 positions shown; findings below may reference images not displayed]

FINDINGS: Anatomic alignment is maintained. There is no acute fracture. Joint
spaces are preserved. There is prominent dorsal calcaneal spurring
at the Achilles insertion. Vascular calcification is noted.
IMPRESSION: Prominent calcaneal spurring at the Achilles insertion.

## 2021-11-05 ENCOUNTER — Encounter: Payer: Self-pay | Admitting: Family Medicine

## 2021-11-05 ENCOUNTER — Ambulatory Visit (INDEPENDENT_AMBULATORY_CARE_PROVIDER_SITE_OTHER): Payer: 59 | Admitting: Family Medicine

## 2021-11-05 VITALS — BP 119/80 | HR 64 | Temp 98.0°F | Ht 67.0 in | Wt 205.1 lb

## 2021-11-05 DIAGNOSIS — Z23 Encounter for immunization: Secondary | ICD-10-CM | POA: Diagnosis not present

## 2021-11-05 DIAGNOSIS — Z0000 Encounter for general adult medical examination without abnormal findings: Secondary | ICD-10-CM | POA: Diagnosis not present

## 2021-11-05 LAB — URINALYSIS, ROUTINE W REFLEX MICROSCOPIC
Bilirubin, UA: NEGATIVE
Glucose, UA: NEGATIVE
Ketones, UA: NEGATIVE
Leukocytes,UA: NEGATIVE
Nitrite, UA: NEGATIVE
Protein,UA: NEGATIVE
RBC, UA: NEGATIVE
Specific Gravity, UA: >1.03 — ABNORMAL HIGH (ref 1.005–1.030)
Urobilinogen, Ur: 0.2 mg/dL (ref 0.2–1.0)
pH, UA: 5.5 (ref 5.0–7.5)

## 2021-11-05 NOTE — Progress Notes (Signed)
BP 119/80   Pulse 64   Temp 98 F (36.7 C)   Ht '5\' 7"'$  (1.702 m)   Wt 205 lb 1.6 oz (93 kg)   SpO2 97%   BMI 32.12 kg/m    Subjective:    Patient ID: Manuel Heath., male    DOB: 1970-07-15, 51 y.o.   MRN: [id number]  HPI: Manuel Heath. is a 51 y.o. male presenting on 11/05/2021 for comprehensive medical examination. Current medical complaints include:none  Interim Problems from his last visit: no  Depression Screen done today and results listed below:     11/05/2021    8:10 AM 10/05/2020    8:15 AM 10/04/2019    8:10 AM 09/28/2018    9:31 AM 04/24/2017   10:13 AM  Depression screen PHQ 2/9  Decreased Interest 0 0 0 0 0  Down, Depressed, Hopeless 0 0 0 0 0  PHQ - 2 Score 0 0 0 0 0  Altered sleeping 0 0  0 0  Tired, decreased energy 0 0  0 0  Change in appetite 0 0  0 0  Feeling bad or failure about yourself  0 0  0 0  Trouble concentrating 0 0  0 0  Moving slowly or fidgety/restless 0 0  0 0  Suicidal thoughts 0 0  0 0  PHQ-9 Score 0 0  0 0  Difficult doing work/chores Not difficult at all   Not difficult at all     Past Medical History:  Past Medical History:  Diagnosis Date   Hearing loss    Wears contact lenses     Surgical History:  Past Surgical History:  Procedure Laterality Date   APPENDECTOMY  08/28/2021   COLONOSCOPY WITH PROPOFOL N/A 05/18/2017   Procedure: COLONOSCOPY WITH PROPOFOL;  Surgeon: Lucilla Lame, MD;  Location: [place];  Service: Endoscopy;  Laterality: N/A;   FOREARM FRACTURE SURGERY     x's 2   POLYPECTOMY  05/18/2017   Procedure: POLYPECTOMY;  Surgeon: Lucilla Lame, MD;  Location: [place];  Service: Endoscopy;;   SKIN GRAFT     ankle    Medications:  No current outpatient medications on file prior to visit.   No current facility-administered medications on file prior to visit.    Allergies:  No Known Allergies  Social History:  Social History   Socioeconomic History   Marital status: Married     Spouse name: Not on file   Number of children: Not on file   Years of education: Not on file   Highest education level: Not on file  Occupational History   Not on file  Tobacco Use   Smoking status: Never   Smokeless tobacco: Never  Vaping Use   Vaping Use: Never used  Substance and Sexual Activity   Alcohol use: Yes    Alcohol/week: 3.0 standard drinks of alcohol    Types: 3 Cans of beer per week    Comment: Socially.   Drug use: No   Sexual activity: Yes  Other Topics Concern   Not on file  Social History Narrative   Not on file   Social Determinants of Health   Financial Resource Strain: Not on file  Food Insecurity: Not on file  Transportation Needs: Not on file  Physical Activity: Not on file  Stress: Not on file  Social Connections: Not on file  Intimate Partner Violence: Not on file   Social History   Tobacco Use  Smoking  Status Never  Smokeless Tobacco Never   Social History   Substance and Sexual Activity  Alcohol Use Yes   Alcohol/week: 3.0 standard drinks of alcohol   Types: 3 Cans of beer per week   Comment: Socially.    Family History:  Family History  Problem Relation Age of Onset   Diabetes Mother    Alzheimer's disease Mother    Heart disease Father    Hypertension Maternal Grandmother    Alcohol abuse Maternal Grandfather    Dementia Paternal Grandmother    Cancer Paternal Grandfather        Lung    Past medical history, surgical history, medications, allergies, family history and social history reviewed with patient today and changes made to appropriate areas of the chart.   Review of Systems  Constitutional: Negative.   HENT: Negative.    Eyes: Negative.   Respiratory: Negative.    Cardiovascular: Negative.   Gastrointestinal: Negative.   Genitourinary: Negative.   Musculoskeletal: Negative.   Skin: Negative.   Neurological: Negative.   Endo/Heme/Allergies: Negative.   Psychiatric/Behavioral: Negative.     All other  ROS negative except what is listed above and in the HPI.      Objective:    BP 119/80   Pulse 64   Temp 98 F (36.7 C)   Ht '5\' 7"'$  (1.702 m)   Wt 205 lb 1.6 oz (93 kg)   SpO2 97%   BMI 32.12 kg/m   Wt Readings from Last 3 Encounters:  11/05/21 205 lb 1.6 oz (93 kg)  10/05/20 201 lb 3.2 oz (91.3 kg)  10/04/19 200 lb 12.8 oz (91.1 kg)    Physical Exam Vitals and nursing note reviewed.  Constitutional:      General: He is not in acute distress.    Appearance: Normal appearance. He is not ill-appearing, toxic-appearing or diaphoretic.  HENT:     Head: Normocephalic and atraumatic.     Right Ear: Tympanic membrane, ear canal and external ear normal. There is no impacted cerumen.     Left Ear: Tympanic membrane, ear canal and external ear normal. There is no impacted cerumen.     Nose: Nose normal. No congestion or rhinorrhea.     Mouth/Throat:     Mouth: Mucous membranes are moist.     Pharynx: Oropharynx is clear. No oropharyngeal exudate or posterior oropharyngeal erythema.  Eyes:     General: No scleral icterus.       Right eye: No discharge.        Left eye: No discharge.     Extraocular Movements: Extraocular movements intact.     Conjunctiva/sclera: Conjunctivae normal.     Pupils: Pupils are equal, round, and reactive to light.  Neck:     Vascular: No carotid bruit.  Cardiovascular:     Rate and Rhythm: Normal rate and regular rhythm.     Pulses: Normal pulses.     Heart sounds: No murmur heard.    No friction rub. No gallop.  Pulmonary:     Effort: Pulmonary effort is normal. No respiratory distress.     Breath sounds: Normal breath sounds. No stridor. No wheezing, rhonchi or rales.  Chest:     Chest wall: No tenderness.  Abdominal:     General: Abdomen is flat. Bowel sounds are normal. There is no distension.     Palpations: Abdomen is soft. There is no mass.     Tenderness: There is no abdominal tenderness. There is no right  CVA tenderness, left CVA  tenderness, guarding or rebound.     Hernia: No hernia is present.  Genitourinary:    Comments: Genital exam deferred with shared decision making Musculoskeletal:        General: No swelling, tenderness, deformity or signs of injury.     Cervical back: Normal range of motion and neck supple. No rigidity. No muscular tenderness.     Right lower leg: No edema.     Left lower leg: No edema.  Lymphadenopathy:     Cervical: No cervical adenopathy.  Skin:    General: Skin is warm and dry.     Capillary Refill: Capillary refill takes less than 2 seconds.     Coloration: Skin is not jaundiced or pale.     Findings: No bruising, erythema, lesion or rash.  Neurological:     General: No focal deficit present.     Mental Status: He is alert and oriented to person, place, and time.     Cranial Nerves: No cranial nerve deficit.     Sensory: No sensory deficit.     Motor: No weakness.     Coordination: Coordination normal.     Gait: Gait normal.     Deep Tendon Reflexes: Reflexes normal.  Psychiatric:        Mood and Affect: Mood normal.        Behavior: Behavior normal.        Thought Content: Thought content normal.        Judgment: Judgment normal.     Results for orders placed or performed during the hospital encounter of 08/28/21  SARS Coronavirus 2 by RT PCR (hospital order, performed in [facility] hospital lab) *cepheid single result test* Urine, Clean Catch   Specimen: Urine, Clean Catch; Nasal Swab  Result Value Ref Range   SARS Coronavirus 2 by RT PCR NEGATIVE NEGATIVE  CBC with Differential  Result Value Ref Range   WBC 18.5 (H) 4.0 - 10.5 K/uL   RBC 4.69 4.22 - 5.81 MIL/uL   Hemoglobin 14.5 13.0 - 17.0 g/dL   HCT 42.6 39.0 - 52.0 %   MCV 90.8 80.0 - 100.0 fL   MCH 30.9 26.0 - 34.0 pg   MCHC 34.0 30.0 - 36.0 g/dL   RDW 12.2 11.5 - 15.5 %   Platelets 225 150 - 400 K/uL   nRBC 0.0 0.0 - 0.2 %   Neutrophils Relative % 84 %   Neutro Abs 15.6 (H) 1.7 - 7.7 K/uL    Lymphocytes Relative 9 %   Lymphs Abs 1.6 0.7 - 4.0 K/uL   Monocytes Relative 7 %   Monocytes Absolute 1.2 (H) 0.1 - 1.0 K/uL   Eosinophils Relative 0 %   Eosinophils Absolute 0.0 0.0 - 0.5 K/uL   Basophils Relative 0 %   Basophils Absolute 0.0 0.0 - 0.1 K/uL   Immature Granulocytes 0 %   Abs Immature Granulocytes 0.06 0.00 - 0.07 K/uL  Comprehensive metabolic panel  Result Value Ref Range   Sodium 138 135 - 145 mmol/L   Potassium 4.9 3.5 - 5.1 mmol/L   Chloride 104 98 - 111 mmol/L   CO2 25 22 - 32 mmol/L   Glucose, Bld 149 (H) 70 - 99 mg/dL   BUN 18 6 - 20 mg/dL   Creatinine, Ser 1.08 0.61 - 1.24 mg/dL   Calcium 9.5 8.9 - 10.3 mg/dL   Total Protein 8.1 6.5 - 8.1 g/dL   Albumin 5.0 3.5 - 5.0 g/dL  AST 26 15 - 41 U/L   ALT 23 0 - 44 U/L   Alkaline Phosphatase 50 38 - 126 U/L   Total Bilirubin 1.4 (H) 0.3 - 1.2 mg/dL   GFR, Estimated >60 >60 mL/min   Anion gap 9 5 - 15  Lipase, blood  Result Value Ref Range   Lipase 27 11 - 51 U/L  Urinalysis, Routine w reflex microscopic Urine, Clean Catch  Result Value Ref Range   Color, Urine YELLOW YELLOW   APPearance CLEAR CLEAR   Specific Gravity, Urine 1.025 1.005 - 1.030   pH 6.0 5.0 - 8.0   Glucose, UA 100 (A) NEGATIVE mg/dL   Hgb urine dipstick NEGATIVE NEGATIVE   Bilirubin Urine NEGATIVE NEGATIVE   Ketones, ur TRACE (A) NEGATIVE mg/dL   Protein, ur TRACE (A) NEGATIVE mg/dL   Nitrite NEGATIVE NEGATIVE   Leukocytes,Ua NEGATIVE NEGATIVE  Urinalysis, Microscopic (reflex)  Result Value Ref Range   RBC / HPF NONE SEEN 0 - 5 RBC/hpf   WBC, UA NONE SEEN 0 - 5 WBC/hpf   Bacteria, UA RARE (A) NONE SEEN   Squamous Epithelial / LPF 0-5 0 - 5      Assessment & Plan:   Problem List Items Addressed This Visit   None Visit Diagnoses     Routine general medical examination at a health care facility    -  Primary   Vaccines up to date. Screening labs checked today. Colonoscopy up to date. Continue diet and exercise. Call with any  concerns.    Relevant Orders   Comprehensive metabolic panel   CBC with Differential/Platelet   Lipid Panel w/o Chol/HDL Ratio   PSA   TSH   Urinalysis, Routine w reflex microscopic   Need for shingles vaccine       Relevant Orders   Zoster Recombinant (Shingrix ) (Completed)   Zoster Recombinant (Shingrix )        Discussed aspirin prophylaxis for myocardial infarction prevention and decision was it was not indicated  LABORATORY TESTING:  Health maintenance labs ordered today as discussed above.   The natural history of prostate cancer and ongoing controversy regarding screening and potential treatment outcomes of prostate cancer has been discussed with the patient. The meaning of a false positive PSA and a false negative PSA has been discussed. He indicates understanding of the limitations of this screening test and wishes to proceed with screening PSA testing.   IMMUNIZATIONS:   - Tdap: Tetanus vaccination status reviewed: last tetanus booster within 10 years. - Influenza: Up to date - Pneumovax: Not applicable - Prevnar: Not applicable - COVID: Up to date - HPV: Not applicable - Shingrix vaccine: Administered today  SCREENING: - Colonoscopy: Up to date  Discussed with patient purpose of the colonoscopy is to detect colon cancer at curable precancerous or early stages   PATIENT COUNSELING:    Sexuality: Discussed sexually transmitted diseases, partner selection, use of condoms, avoidance of unintended pregnancy  and contraceptive alternatives.   Advised to avoid cigarette smoking.  I discussed with the patient that most people either abstain from alcohol or drink within safe limits (<=14/week and <=4 drinks/occasion for males, <=7/weeks and <= 3 drinks/occasion for females) and that the risk for alcohol disorders and other health effects rises proportionally with the number of drinks per week and how often a drinker exceeds daily limits.  Discussed cessation/primary  prevention of drug use and availability of treatment for abuse.   Diet: Encouraged to adjust caloric intake  to maintain  or achieve ideal body weight, to reduce intake of dietary saturated fat and total fat, to limit sodium intake by avoiding high sodium foods and not adding table salt, and to maintain adequate dietary potassium and calcium preferably from fresh fruits, vegetables, and low-fat dairy products.    stressed the importance of regular exercise  Injury prevention: Discussed safety belts, safety helmets, smoke detector, smoking near bedding or upholstery.   Dental health: Discussed importance of regular tooth brushing, flossing, and dental visits.   Follow up plan: NEXT PREVENTATIVE PHYSICAL DUE IN 1 YEAR. Return in about 1 year (around 11/06/2022) for physical ,3 months nurse only for 2nd shingles.

## 2021-11-06 LAB — COMPREHENSIVE METABOLIC PANEL
ALT: 23 IU/L (ref 0–44)
AST: 20 IU/L (ref 0–40)
Albumin/Globulin Ratio: 2 (ref 1.2–2.2)
Albumin: 4.7 g/dL (ref 3.8–4.9)
Alkaline Phosphatase: 68 IU/L (ref 44–121)
BUN/Creatinine Ratio: 16 (ref 9–20)
BUN: 19 mg/dL (ref 6–24)
Bilirubin Total: 1.3 mg/dL — ABNORMAL HIGH (ref 0.0–1.2)
CO2: 22 mmol/L (ref 20–29)
Calcium: 9.1 mg/dL (ref 8.7–10.2)
Chloride: 103 mmol/L (ref 96–106)
Creatinine, Ser: 1.22 mg/dL (ref 0.76–1.27)
Globulin, Total: 2.3 g/dL (ref 1.5–4.5)
Glucose: 115 mg/dL — ABNORMAL HIGH (ref 70–99)
Potassium: 4 mmol/L (ref 3.5–5.2)
Sodium: 139 mmol/L (ref 134–144)
Total Protein: 7 g/dL (ref 6.0–8.5)
eGFR: 71 mL/min/{1.73_m2} (ref >59)

## 2021-11-06 LAB — CBC WITH DIFFERENTIAL/PLATELET
Basophils Absolute: 0 10*3/uL (ref 0.0–0.2)
Basos: 0 %
EOS (ABSOLUTE): 0.1 10*3/uL (ref 0.0–0.4)
Eos: 2 %
Hematocrit: 43.1 % (ref 37.5–51.0)
Hemoglobin: 14.3 g/dL (ref 13.0–17.7)
Immature Grans (Abs): 0 10*3/uL (ref 0.0–0.1)
Immature Granulocytes: 0 %
Lymphocytes Absolute: 1.8 10*3/uL (ref 0.7–3.1)
Lymphs: 36 %
MCH: 30.5 pg (ref 26.6–33.0)
MCHC: 33.2 g/dL (ref 31.5–35.7)
MCV: 92 fL (ref 79–97)
Monocytes Absolute: 0.5 10*3/uL (ref 0.1–0.9)
Monocytes: 10 %
Neutrophils Absolute: 2.6 10*3/uL (ref 1.4–7.0)
Neutrophils: 52 %
Platelets: 190 10*3/uL (ref 150–450)
RBC: 4.69 x10E6/uL (ref 4.14–5.80)
RDW: 11.9 % (ref 11.6–15.4)
WBC: 5 10*3/uL (ref 3.4–10.8)

## 2021-11-06 LAB — LIPID PANEL W/O CHOL/HDL RATIO
Cholesterol, Total: 184 mg/dL (ref 100–199)
HDL: 29 mg/dL — ABNORMAL LOW (ref >39)
LDL Chol Calc (NIH): 132 mg/dL — ABNORMAL HIGH (ref 0–99)
Triglycerides: 127 mg/dL (ref 0–149)
VLDL Cholesterol Cal: 23 mg/dL (ref 5–40)

## 2021-11-06 LAB — TSH: TSH: 1.38 u[IU]/mL (ref 0.450–4.500)

## 2021-11-06 LAB — PSA: Prostate Specific Ag, Serum: 0.9 ng/mL (ref 0.0–4.0)

## 2022-02-05 ENCOUNTER — Ambulatory Visit (INDEPENDENT_AMBULATORY_CARE_PROVIDER_SITE_OTHER): Payer: 59

## 2022-02-05 DIAGNOSIS — Z23 Encounter for immunization: Secondary | ICD-10-CM

## 2022-10-23 ENCOUNTER — Encounter: Payer: 59 | Admitting: Family Medicine

## 2022-11-10 ENCOUNTER — Encounter: Payer: 59 | Admitting: Family Medicine

## 2022-11-20 ENCOUNTER — Encounter: Payer: Self-pay | Admitting: Family Medicine

## 2022-11-20 ENCOUNTER — Ambulatory Visit (INDEPENDENT_AMBULATORY_CARE_PROVIDER_SITE_OTHER): Payer: 59 | Admitting: Family Medicine

## 2022-11-20 VITALS — BP 134/82 | HR 61 | Ht 67.0 in | Wt 200.8 lb

## 2022-11-20 DIAGNOSIS — Z0000 Encounter for general adult medical examination without abnormal findings: Secondary | ICD-10-CM

## 2022-11-20 NOTE — Progress Notes (Signed)
BP 134/82   Pulse 61   Ht 5\' 7"  (1.702 m)   Wt 200 lb 12.8 oz (91.1 kg)   SpO2 98%   BMI 31.45 kg/m    Subjective:    Patient ID: Manuel Banker., male    DOB: 1970/10/17, 52 y.o.   MRN: [id number]  HPI: Manuel Zeoli. is a 52 y.o. male presenting on 11/20/2022 for comprehensive medical examination. Current medical complaints include:none  Interim Problems from his last visit: no  Depression Screen done today and results listed below:     11/20/2022    8:10 AM 11/05/2021    8:10 AM 10/05/2020    8:15 AM 10/04/2019    8:10 AM 09/28/2018    9:31 AM  Depression screen PHQ 2/9  Decreased Interest 0 0 0 0 0  Down, Depressed, Hopeless 0 0 0 0 0  PHQ - 2 Score 0 0 0 0 0  Altered sleeping 0 0 0  0  Tired, decreased energy 0 0 0  0  Change in appetite 0 0 0  0  Feeling bad or failure about yourself  0 0 0  0  Trouble concentrating 0 0 0  0  Moving slowly or fidgety/restless 0 0 0  0  Suicidal thoughts 0 0 0  0  PHQ-9 Score 0 0 0  0  Difficult doing work/chores  Not difficult at all   Not difficult at all     Past Medical History:  Past Medical History:  Diagnosis Date   Hearing loss    Wears contact lenses     Surgical History:  Past Surgical History:  Procedure Laterality Date   APPENDECTOMY  08/28/2021   COLONOSCOPY WITH PROPOFOL N/A 05/18/2017   Procedure: COLONOSCOPY WITH PROPOFOL;  Surgeon: Midge Minium, MD;  Location: [facility];  Service: Endoscopy;  Laterality: N/A;   FOREARM FRACTURE SURGERY     x's 2   POLYPECTOMY  05/18/2017   Procedure: POLYPECTOMY;  Surgeon: Midge Minium, MD;  Location: [facility];  Service: Endoscopy;;   SKIN GRAFT     ankle    Medications:  No current outpatient medications on file prior to visit.   No current facility-administered medications on file prior to visit.    Allergies:  No Known Allergies  Social History:  Social History   Socioeconomic History   Marital status: Married    Spouse name:  Not on file   Number of children: Not on file   Years of education: Not on file   Highest education level: Some college, no degree  Occupational History   Not on file  Tobacco Use   Smoking status: Never   Smokeless tobacco: Never  Vaping Use   Vaping status: Never Used  Substance and Sexual Activity   Alcohol use: Yes    Alcohol/week: 3.0 standard drinks of alcohol    Types: 3 Cans of beer per week    Comment: Socially.   Drug use: No   Sexual activity: Yes  Other Topics Concern   Not on file  Social History Narrative   Not on file   Social Determinants of Health   Financial Resource Strain: Low Risk  (11/16/2022)   Overall Financial Resource Strain (CARDIA)    Difficulty of Paying Living Expenses: Not very hard  Food Insecurity: No Food Insecurity (11/16/2022)   Hunger Vital Sign    Worried About Running Out of Food in the Last Year: Never true    Ran  Out of Food in the Last Year: Never true  Transportation Needs: No Transportation Needs (11/16/2022)   PRAPARE - Administrator, Civil Service (Medical): No    Lack of Transportation (Non-Medical): No  Physical Activity: Insufficiently Active (11/16/2022)   Exercise Vital Sign    Days of Exercise per Week: 3 days    Minutes of Exercise per Session: 30 min  Stress: No Stress Concern Present (11/16/2022)   [employer] of Occupational Health - Occupational Stress Questionnaire    Feeling of Stress : Only a little  Social Connections: Socially Integrated (11/16/2022)   Social Connection and Isolation Panel [NHANES]    Frequency of Communication with Friends and Family: More than three times a week    Frequency of Social Gatherings with Friends and Family: Three times a week    Attends Religious Services: More than 4 times per year    Active Member of Clubs or Organizations: Yes    Attends Engineer, structural: More than 4 times per year    Marital Status: Married  Catering manager Violence: Not  on file   Social History   Tobacco Use  Smoking Status Never  Smokeless Tobacco Never   Social History   Substance and Sexual Activity  Alcohol Use Yes   Alcohol/week: 3.0 standard drinks of alcohol   Types: 3 Cans of beer per week   Comment: Socially.    Family History:  Family History  Problem Relation Age of Onset   Diabetes Mother    Alzheimer's disease Mother    Heart disease Father    Hypertension Maternal Grandmother    Alcohol abuse Maternal Grandfather    Dementia Paternal Grandmother    Cancer Paternal Grandfather        Lung    Past medical history, surgical history, medications, allergies, family history and social history reviewed with patient today and changes made to appropriate areas of the chart.   Review of Systems  Constitutional: Negative.   HENT: Negative.    Eyes: Negative.   Respiratory: Negative.    Cardiovascular: Negative.   Gastrointestinal: Negative.   Genitourinary: Negative.   Musculoskeletal: Negative.   Skin: Negative.   Neurological: Negative.   Endo/Heme/Allergies: Negative.   Psychiatric/Behavioral: Negative.     All other ROS negative except what is listed above and in the HPI.      Objective:    BP 134/82   Pulse 61   Ht 5\' 7"  (1.702 m)   Wt 200 lb 12.8 oz (91.1 kg)   SpO2 98%   BMI 31.45 kg/m   Wt Readings from Last 3 Encounters:  11/20/22 200 lb 12.8 oz (91.1 kg)  11/05/21 205 lb 1.6 oz (93 kg)  10/05/20 201 lb 3.2 oz (91.3 kg)    Physical Exam Vitals and nursing note reviewed.  Constitutional:      General: He is not in acute distress.    Appearance: Normal appearance. He is not ill-appearing, toxic-appearing or diaphoretic.  HENT:     Head: Normocephalic and atraumatic.     Right Ear: Tympanic membrane, ear canal and external ear normal. There is no impacted cerumen.     Left Ear: Tympanic membrane, ear canal and external ear normal. There is no impacted cerumen.     Nose: Nose normal. No congestion or  rhinorrhea.     Mouth/Throat:     Mouth: Mucous membranes are moist.     Pharynx: Oropharynx is clear. No oropharyngeal exudate or posterior  oropharyngeal erythema.  Eyes:     General: No scleral icterus.       Right eye: No discharge.        Left eye: No discharge.     Extraocular Movements: Extraocular movements intact.     Conjunctiva/sclera: Conjunctivae normal.     Pupils: Pupils are equal, round, and reactive to light.  Neck:     Vascular: No carotid bruit.  Cardiovascular:     Rate and Rhythm: Normal rate and regular rhythm.     Pulses: Normal pulses.     Heart sounds: No murmur heard.    No friction rub. No gallop.  Pulmonary:     Effort: Pulmonary effort is normal. No respiratory distress.     Breath sounds: Normal breath sounds. No stridor. No wheezing, rhonchi or rales.  Chest:     Chest wall: No tenderness.  Abdominal:     General: Abdomen is flat. Bowel sounds are normal. There is no distension.     Palpations: Abdomen is soft. There is no mass.     Tenderness: There is no abdominal tenderness. There is no right CVA tenderness, left CVA tenderness, guarding or rebound.     Hernia: No hernia is present.  Genitourinary:    Comments: Genital exam deferred with shared decision making Musculoskeletal:        General: No swelling, tenderness, deformity or signs of injury.     Cervical back: Normal range of motion and neck supple. No rigidity. No muscular tenderness.     Right lower leg: No edema.     Left lower leg: No edema.  Lymphadenopathy:     Cervical: No cervical adenopathy.  Skin:    General: Skin is warm and dry.     Capillary Refill: Capillary refill takes less than 2 seconds.     Coloration: Skin is not jaundiced or pale.     Findings: No bruising, erythema, lesion or rash.  Neurological:     General: No focal deficit present.     Mental Status: He is alert and oriented to person, place, and time.     Cranial Nerves: No cranial nerve deficit.      Sensory: No sensory deficit.     Motor: No weakness.     Coordination: Coordination normal.     Gait: Gait normal.     Deep Tendon Reflexes: Reflexes normal.  Psychiatric:        Mood and Affect: Mood normal.        Behavior: Behavior normal.        Thought Content: Thought content normal.        Judgment: Judgment normal.     Results for orders placed or performed in visit on 11/05/21  Comprehensive metabolic panel  Result Value Ref Range   Glucose 115 (H) 70 - 99 mg/dL   BUN 19 6 - 24 mg/dL   Creatinine, Ser 8.29 0.76 - 1.27 mg/dL   eGFR 71 >56 OZ/HYQ/6.57   BUN/Creatinine Ratio 16 9 - 20   Sodium 139 134 - 144 mmol/L   Potassium 4.0 3.5 - 5.2 mmol/L   Chloride 103 96 - 106 mmol/L   CO2 22 20 - 29 mmol/L   Calcium 9.1 8.7 - 10.2 mg/dL   Total Protein 7.0 6.0 - 8.5 g/dL   Albumin 4.7 3.8 - 4.9 g/dL   Globulin, Total 2.3 1.5 - 4.5 g/dL   Albumin/Globulin Ratio 2.0 1.2 - 2.2   Bilirubin Total 1.3 (H) 0.0 -  1.2 mg/dL   Alkaline Phosphatase 68 44 - 121 IU/L   AST 20 0 - 40 IU/L   ALT 23 0 - 44 IU/L  CBC with Differential/Platelet  Result Value Ref Range   WBC 5.0 3.4 - 10.8 x10E3/uL   RBC 4.69 4.14 - 5.80 x10E6/uL   Hemoglobin 14.3 13.0 - 17.7 g/dL   Hematocrit 14.7 82.9 - 51.0 %   MCV 92 79 - 97 fL   MCH 30.5 26.6 - 33.0 pg   MCHC 33.2 31.5 - 35.7 g/dL   RDW 56.2 13.0 - 86.5 %   Platelets 190 150 - 450 x10E3/uL   Neutrophils 52 Not Estab. %   Lymphs 36 Not Estab. %   Monocytes 10 Not Estab. %   Eos 2 Not Estab. %   Basos 0 Not Estab. %   Neutrophils Absolute 2.6 1.4 - 7.0 x10E3/uL   Lymphocytes Absolute 1.8 0.7 - 3.1 x10E3/uL   Monocytes Absolute 0.5 0.1 - 0.9 x10E3/uL   EOS (ABSOLUTE) 0.1 0.0 - 0.4 x10E3/uL   Basophils Absolute 0.0 0.0 - 0.2 x10E3/uL   Immature Granulocytes 0 Not Estab. %   Immature Grans (Abs) 0.0 0.0 - 0.1 x10E3/uL  Lipid Panel w/o Chol/HDL Ratio  Result Value Ref Range   Cholesterol, Total 184 100 - 199 mg/dL   Triglycerides 784 0 - 149  mg/dL   HDL 29 (L) >69 mg/dL   VLDL Cholesterol Cal 23 5 - 40 mg/dL   LDL Chol Calc (NIH) 629 (H) 0 - 99 mg/dL  PSA  Result Value Ref Range   Prostate Specific Ag, Serum 0.9 0.0 - 4.0 ng/mL  TSH  Result Value Ref Range   TSH 1.380 0.450 - 4.500 uIU/mL  Urinalysis, Routine w reflex microscopic  Result Value Ref Range   Specific Gravity, UA >1.030 (H) 1.005 - 1.030   pH, UA 5.5 5.0 - 7.5   Color, UA Yellow Yellow   Appearance Ur Clear Clear   Leukocytes,UA Negative Negative   Protein,UA Negative Negative/Trace   Glucose, UA Negative Negative   Ketones, UA Negative Negative   RBC, UA Negative Negative   Bilirubin, UA Negative Negative   Urobilinogen, Ur 0.2 0.2 - 1.0 mg/dL   Nitrite, UA Negative Negative      Assessment & Plan:   Problem List Items Addressed This Visit   None Visit Diagnoses     Routine general medical examination at a health care facility    -  Primary   Vaccines up to date. Screening labs checked today. Colonoscopy up to date. Continue diet and exercise. Call with any concerns.   Relevant Orders   Comprehensive metabolic panel   CBC with Differential/Platelet   Lipid Panel w/o Chol/HDL Ratio   PSA   TSH         LABORATORY TESTING:  Health maintenance labs ordered today as discussed above.   The natural history of prostate cancer and ongoing controversy regarding screening and potential treatment outcomes of prostate cancer has been discussed with the patient. The meaning of a false positive PSA and a false negative PSA has been discussed. He indicates understanding of the limitations of this screening test and wishes to proceed with screening PSA testing.  IMMUNIZATIONS:   - Tdap: Tetanus vaccination status reviewed: last tetanus booster within 10 years. - Influenza: Up to date - Pneumovax: Not applicable - Prevnar: Not applicable - COVID: will consider- just got his flu shot - HPV: Not applicable - Shingrix vaccine: Up  to date  SCREENING: -  Colonoscopy: Up to date  Discussed with patient purpose of the colonoscopy is to detect colon cancer at curable precancerous or early stages   PATIENT COUNSELING:    Sexuality: Discussed sexually transmitted diseases, partner selection, use of condoms, avoidance of unintended pregnancy  and contraceptive alternatives.   Advised to avoid cigarette smoking.  I discussed with the patient that most people either abstain from alcohol or drink within safe limits (<=14/week and <=4 drinks/occasion for males, <=7/weeks and <= 3 drinks/occasion for females) and that the risk for alcohol disorders and other health effects rises proportionally with the number of drinks per week and how often a drinker exceeds daily limits.  Discussed cessation/primary prevention of drug use and availability of treatment for abuse.   Diet: Encouraged to adjust caloric intake to maintain  or achieve ideal body weight, to reduce intake of dietary saturated fat and total fat, to limit sodium intake by avoiding high sodium foods and not adding table salt, and to maintain adequate dietary potassium and calcium preferably from fresh fruits, vegetables, and low-fat dairy products.    stressed the importance of regular exercise  Injury prevention: Discussed safety belts, safety helmets, smoke detector, smoking near bedding or upholstery.   Dental health: Discussed importance of regular tooth brushing, flossing, and dental visits.   Follow up plan: NEXT PREVENTATIVE PHYSICAL DUE IN 1 YEAR. Return in about 1 year (around 11/20/2023) for physical.

## 2022-11-21 LAB — LIPID PANEL W/O CHOL/HDL RATIO
Cholesterol, Total: 175 mg/dL (ref 100–199)
HDL: 27 mg/dL — ABNORMAL LOW (ref >39)
LDL Chol Calc (NIH): 131 mg/dL — ABNORMAL HIGH (ref 0–99)
Triglycerides: 93 mg/dL (ref 0–149)
VLDL Cholesterol Cal: 17 mg/dL (ref 5–40)

## 2022-11-21 LAB — CBC WITH DIFFERENTIAL/PLATELET
Basophils Absolute: 0 10*3/uL (ref 0.0–0.2)
Basos: 1 %
EOS (ABSOLUTE): 0.1 10*3/uL (ref 0.0–0.4)
Eos: 2 %
Hematocrit: 44.8 % (ref 37.5–51.0)
Hemoglobin: 14.8 g/dL (ref 13.0–17.7)
Immature Grans (Abs): 0 10*3/uL (ref 0.0–0.1)
Immature Granulocytes: 0 %
Lymphocytes Absolute: 1.7 10*3/uL (ref 0.7–3.1)
Lymphs: 31 %
MCH: 31.1 pg (ref 26.6–33.0)
MCHC: 33 g/dL (ref 31.5–35.7)
MCV: 94 fL (ref 79–97)
Monocytes Absolute: 0.5 10*3/uL (ref 0.1–0.9)
Monocytes: 9 %
Neutrophils Absolute: 3.2 10*3/uL (ref 1.4–7.0)
Neutrophils: 57 %
Platelets: 201 10*3/uL (ref 150–450)
RBC: 4.76 x10E6/uL (ref 4.14–5.80)
RDW: 11.8 % (ref 11.6–15.4)
WBC: 5.6 10*3/uL (ref 3.4–10.8)

## 2022-11-21 LAB — COMPREHENSIVE METABOLIC PANEL
ALT: 25 [IU]/L (ref 0–44)
AST: 22 [IU]/L (ref 0–40)
Albumin: 4.8 g/dL (ref 3.8–4.9)
Alkaline Phosphatase: 63 [IU]/L (ref 44–121)
BUN/Creatinine Ratio: 12 (ref 9–20)
BUN: 14 mg/dL (ref 6–24)
Bilirubin Total: 0.9 mg/dL (ref 0.0–1.2)
CO2: 24 mmol/L (ref 20–29)
Calcium: 9.2 mg/dL (ref 8.7–10.2)
Chloride: 103 mmol/L (ref 96–106)
Creatinine, Ser: 1.17 mg/dL (ref 0.76–1.27)
Globulin, Total: 2.3 g/dL (ref 1.5–4.5)
Glucose: 110 mg/dL — ABNORMAL HIGH (ref 70–99)
Potassium: 4.4 mmol/L (ref 3.5–5.2)
Sodium: 141 mmol/L (ref 134–144)
Total Protein: 7.1 g/dL (ref 6.0–8.5)
eGFR: 75 mL/min/{1.73_m2} (ref >59)

## 2022-11-21 LAB — TSH: TSH: 2.11 u[IU]/mL (ref 0.450–4.500)

## 2022-11-21 LAB — PSA: Prostate Specific Ag, Serum: 0.9 ng/mL (ref 0.0–4.0)

## 2023-11-23 ENCOUNTER — Ambulatory Visit: Payer: Self-pay | Admitting: Family Medicine

## 2023-11-23 VITALS — BP 132/72 | HR 69 | Temp 98.5°F | Ht 67.0 in | Wt 201.8 lb

## 2023-11-23 DIAGNOSIS — Z23 Encounter for immunization: Secondary | ICD-10-CM

## 2023-11-23 DIAGNOSIS — Z0000 Encounter for general adult medical examination without abnormal findings: Secondary | ICD-10-CM

## 2023-11-23 NOTE — Progress Notes (Signed)
 BP 132/72   Pulse 69   Temp 98.5 F (36.9 C) (Oral)   Ht 5' 7 (1.702 m)   Wt 201 lb 12.8 oz (91.5 kg)   SpO2 96%   BMI 31.61 kg/m    Subjective:    Patient ID: Manuel Heath., male    DOB: 11/21/1970, 53 y.o.   MRN: [id number]  HPI: Manuel Heath. is a 53 y.o. male presenting on 11/23/2023 for comprehensive medical examination. Current medical complaints include:none  Interim Problems from his last visit: no  Depression Screen done today and results listed below:     11/23/2023    8:15 AM 11/20/2022    8:10 AM 11/05/2021    8:10 AM 10/05/2020    8:15 AM 10/04/2019    8:10 AM  Depression screen PHQ 2/9  Decreased Interest 0 0 0 0 0  Down, Depressed, Hopeless 0 0 0 0 0  PHQ - 2 Score 0 0 0 0 0  Altered sleeping 0 0 0 0   Tired, decreased energy 0 0 0 0   Change in appetite 0 0 0 0   Feeling bad or failure about yourself  0 0 0 0   Trouble concentrating 0 0 0 0   Moving slowly or fidgety/restless 0 0 0 0   Suicidal thoughts 0 0 0 0   PHQ-9 Score 0 0 0 0   Difficult doing work/chores   Not difficult at all      Past Medical History:  Past Medical History:  Diagnosis Date   Hearing loss    Sleep apnea 2019   Wears contact lenses     Surgical History:  Past Surgical History:  Procedure Laterality Date   APPENDECTOMY  july 23   COLONOSCOPY WITH PROPOFOL  N/A 05/18/2017   Procedure: COLONOSCOPY WITH PROPOFOL ;  Surgeon: Jinny Carmine, MD;  Location: [facility];  Service: Endoscopy;  Laterality: N/A;   FOREARM FRACTURE SURGERY     x's 2   POLYPECTOMY  05/18/2017   Procedure: POLYPECTOMY;  Surgeon: Jinny Carmine, MD;  Location: [facility];  Service: Endoscopy;;   SKIN GRAFT     ankle    Medications:  No current outpatient medications on file prior to visit.   No current facility-administered medications on file prior to visit.    Allergies:  No Known Allergies  Social History:  Social History   Socioeconomic History   Marital  status: Married    Spouse name: Not on file   Number of children: Not on file   Years of education: Not on file   Highest education level: Some college, no degree  Occupational History   Not on file  Tobacco Use   Smoking status: Never   Smokeless tobacco: Never  Vaping Use   Vaping status: Never Used  Substance and Sexual Activity   Alcohol use: Yes    Alcohol/week: 2.0 standard drinks of alcohol    Types: 2 Cans of beer per week    Comment: Occasional   Drug use: No   Sexual activity: Yes  Other Topics Concern   Not on file  Social History Narrative   Not on file   Social Drivers of Health   Financial Resource Strain: Low Risk  (11/23/2023)   Overall Financial Resource Strain (CARDIA)    Difficulty of Paying Living Expenses: Not hard at all  Food Insecurity: No Food Insecurity (11/23/2023)   Hunger Vital Sign    Worried About Running Out of  Food in the Last Year: Never true    Ran Out of Food in the Last Year: Never true  Transportation Needs: No Transportation Needs (11/23/2023)   PRAPARE - Administrator, Civil Service (Medical): No    Lack of Transportation (Non-Medical): No  Physical Activity: Insufficiently Active (11/23/2023)   Exercise Vital Sign    Days of Exercise per Week: 2 days    Minutes of Exercise per Session: 20 min  Stress: No Stress Concern Present (11/23/2023)   [employer] of Occupational Health - Occupational Stress Questionnaire    Feeling of Stress: Only a little  Social Connections: Socially Integrated (11/23/2023)   Social Connection and Isolation Panel    Frequency of Communication with Friends and Family: More than three times a week    Frequency of Social Gatherings with Friends and Family: Once a week    Attends Religious Services: More than 4 times per year    Active Member of [employer] or Organizations: Yes    Attends Engineer, structural: More than 4 times per year    Marital Status: Married  Catering manager  Violence: Not At Risk (11/23/2023)   Humiliation, Afraid, Rape, and Kick questionnaire    Fear of Current or Ex-Partner: No    Emotionally Abused: No    Physically Abused: No    Sexually Abused: No   Social History   Tobacco Use  Smoking Status Never  Smokeless Tobacco Never   Social History   Substance and Sexual Activity  Alcohol Use Yes   Alcohol/week: 2.0 standard drinks of alcohol   Types: 2 Cans of beer per week   Comment: Occasional    Family History:  Family History  Problem Relation Age of Onset   Diabetes Mother    Alzheimer's disease Mother    Heart disease Father    Alcohol abuse Father    Hypertension Father    Hypertension Maternal Grandmother    Alcohol abuse Maternal Grandfather    Dementia Paternal Grandmother    Cancer Paternal Grandfather        Lung    Past medical history, surgical history, medications, allergies, family history and social history reviewed with patient today and changes made to appropriate areas of the chart.   Review of Systems  Constitutional: Negative.   HENT:  Positive for sore throat. Negative for congestion, ear discharge, ear pain, hearing loss, nosebleeds, sinus pain and tinnitus.   Eyes: Negative.   Respiratory: Negative.  Negative for stridor.   Cardiovascular: Negative.   Gastrointestinal: Negative.   Genitourinary: Negative.   Musculoskeletal: Negative.   Skin: Negative.   Neurological: Negative.   Endo/Heme/Allergies: Negative.   Psychiatric/Behavioral: Negative.     All other ROS negative except what is listed above and in the HPI.      Objective:    BP 132/72   Pulse 69   Temp 98.5 F (36.9 C) (Oral)   Ht 5' 7 (1.702 m)   Wt 201 lb 12.8 oz (91.5 kg)   SpO2 96%   BMI 31.61 kg/m   Wt Readings from Last 3 Encounters:  11/23/23 201 lb 12.8 oz (91.5 kg)  11/20/22 200 lb 12.8 oz (91.1 kg)  11/05/21 205 lb 1.6 oz (93 kg)    Physical Exam Vitals and nursing note reviewed.  Constitutional:       General: He is not in acute distress.    Appearance: Normal appearance. He is obese. He is not ill-appearing, toxic-appearing or diaphoretic.  HENT:     Head: Normocephalic and atraumatic.     Right Ear: Tympanic membrane, ear canal and external ear normal. There is no impacted cerumen.     Left Ear: Tympanic membrane, ear canal and external ear normal. There is no impacted cerumen.     Nose: Nose normal. No congestion or rhinorrhea.     Mouth/Throat:     Mouth: Mucous membranes are moist.     Pharynx: Oropharynx is clear. No oropharyngeal exudate or posterior oropharyngeal erythema.  Eyes:     General: No scleral icterus.       Right eye: No discharge.        Left eye: No discharge.     Extraocular Movements: Extraocular movements intact.     Conjunctiva/sclera: Conjunctivae normal.     Pupils: Pupils are equal, round, and reactive to light.  Neck:     Vascular: No carotid bruit.  Cardiovascular:     Rate and Rhythm: Normal rate and regular rhythm.     Pulses: Normal pulses.     Heart sounds: No murmur heard.    No friction rub. No gallop.  Pulmonary:     Effort: Pulmonary effort is normal. No respiratory distress.     Breath sounds: Normal breath sounds. No stridor. No wheezing, rhonchi or rales.  Chest:     Chest wall: No tenderness.  Abdominal:     General: Abdomen is flat. Bowel sounds are normal. There is no distension.     Palpations: Abdomen is soft. There is no mass.     Tenderness: There is no abdominal tenderness. There is no right CVA tenderness, left CVA tenderness, guarding or rebound.     Hernia: No hernia is present.  Genitourinary:    Comments: Genital exam deferred with shared decision making Musculoskeletal:        General: No swelling, tenderness, deformity or signs of injury.     Cervical back: Normal range of motion and neck supple. No rigidity. No muscular tenderness.     Right lower leg: No edema.     Left lower leg: No edema.  Lymphadenopathy:      Cervical: No cervical adenopathy.  Skin:    General: Skin is warm and dry.     Capillary Refill: Capillary refill takes less than 2 seconds.     Coloration: Skin is not jaundiced or pale.     Findings: No bruising, erythema, lesion or rash.  Neurological:     General: No focal deficit present.     Mental Status: He is alert and oriented to person, place, and time.     Cranial Nerves: No cranial nerve deficit.     Sensory: No sensory deficit.     Motor: No weakness.     Coordination: Coordination normal.     Gait: Gait normal.     Deep Tendon Reflexes: Reflexes normal.  Psychiatric:        Mood and Affect: Mood normal.        Behavior: Behavior normal.        Thought Content: Thought content normal.        Judgment: Judgment normal.     Results for orders placed or performed in visit on 11/20/22  Comprehensive metabolic panel   Collection Time: 11/20/22  8:28 AM  Result Value Ref Range   Glucose 110 (H) 70 - 99 mg/dL   BUN 14 6 - 24 mg/dL   Creatinine, Ser 8.82 0.76 - 1.27 mg/dL   eGFR  75 >59 mL/min/1.73   BUN/Creatinine Ratio 12 9 - 20   Sodium 141 134 - 144 mmol/L   Potassium 4.4 3.5 - 5.2 mmol/L   Chloride 103 96 - 106 mmol/L   CO2 24 20 - 29 mmol/L   Calcium 9.2 8.7 - 10.2 mg/dL   Total Protein 7.1 6.0 - 8.5 g/dL   Albumin 4.8 3.8 - 4.9 g/dL   Globulin, Total 2.3 1.5 - 4.5 g/dL   Bilirubin Total 0.9 0.0 - 1.2 mg/dL   Alkaline Phosphatase 63 44 - 121 IU/L   AST 22 0 - 40 IU/L   ALT 25 0 - 44 IU/L  CBC with Differential/Platelet   Collection Time: 11/20/22  8:28 AM  Result Value Ref Range   WBC 5.6 3.4 - 10.8 x10E3/uL   RBC 4.76 4.14 - 5.80 x10E6/uL   Hemoglobin 14.8 13.0 - 17.7 g/dL   Hematocrit 55.1 62.4 - 51.0 %   MCV 94 79 - 97 fL   MCH 31.1 26.6 - 33.0 pg   MCHC 33.0 31.5 - 35.7 g/dL   RDW 88.1 88.3 - 84.5 %   Platelets 201 150 - 450 x10E3/uL   Neutrophils 57 Not Estab. %   Lymphs 31 Not Estab. %   Monocytes 9 Not Estab. %   Eos 2 Not Estab. %   Basos  1 Not Estab. %   Neutrophils Absolute 3.2 1.4 - 7.0 x10E3/uL   Lymphocytes Absolute 1.7 0.7 - 3.1 x10E3/uL   Monocytes Absolute 0.5 0.1 - 0.9 x10E3/uL   EOS (ABSOLUTE) 0.1 0.0 - 0.4 x10E3/uL   Basophils Absolute 0.0 0.0 - 0.2 x10E3/uL   Immature Granulocytes 0 Not Estab. %   Immature Grans (Abs) 0.0 0.0 - 0.1 x10E3/uL  Lipid Panel w/o Chol/HDL Ratio   Collection Time: 11/20/22  8:28 AM  Result Value Ref Range   Cholesterol, Total 175 100 - 199 mg/dL   Triglycerides 93 0 - 149 mg/dL   HDL 27 (L) >60 mg/dL   VLDL Cholesterol Cal 17 5 - 40 mg/dL   LDL Chol Calc (NIH) 868 (H) 0 - 99 mg/dL  PSA   Collection Time: 11/20/22  8:28 AM  Result Value Ref Range   Prostate Specific Ag, Serum 0.9 0.0 - 4.0 ng/mL  TSH   Collection Time: 11/20/22  8:28 AM  Result Value Ref Range   TSH 2.110 0.450 - 4.500 uIU/mL      Assessment & Plan:   Problem List Items Addressed This Visit   None Visit Diagnoses       Routine general medical examination at a health care facility    -  Primary   Vaccines up to date. Screening labs checked today. Colonscopy up to date. Continue diet and exercise. Call with any concerns.   Relevant Orders   Comprehensive metabolic panel with GFR   CBC with Differential/Platelet   Lipid Panel w/o Chol/HDL Ratio   PSA   TSH   Hepatitis B surface antibody,quantitative     Need for pneumococcal 20-valent conjugate vaccination       Prevnar given today.   Relevant Orders   Pneumococcal conjugate vaccine 20-valent (Prevnar 20)        LABORATORY TESTING:  Health maintenance labs ordered today as discussed above.   The natural history of prostate cancer and ongoing controversy regarding screening and potential treatment outcomes of prostate cancer has been discussed with the patient. The meaning of a false positive PSA and a false negative PSA has  been discussed. He indicates understanding of the limitations of this screening test and wishes to proceed with screening PSA  testing.   IMMUNIZATIONS:   - Tdap: Tetanus vaccination status reviewed: last tetanus booster within 10 years. - Influenza: Up to date - Prevnar: Administered today - COVID: Refused - HPV: Not applicable - Shingrix  vaccine: Up to date  SCREENING: - Colonoscopy: Up to date  Discussed with patient purpose of the colonoscopy is to detect colon cancer at curable precancerous or early stages   PATIENT COUNSELING:    Sexuality: Discussed sexually transmitted diseases, partner selection, use of condoms, avoidance of unintended pregnancy  and contraceptive alternatives.   Advised to avoid cigarette smoking.  I discussed with the patient that most people either abstain from alcohol or drink within safe limits (<=14/week and <=4 drinks/occasion for males, <=7/weeks and <= 3 drinks/occasion for females) and that the risk for alcohol disorders and other health effects rises proportionally with the number of drinks per week and how often a drinker exceeds daily limits.  Discussed cessation/primary prevention of drug use and availability of treatment for abuse.   Diet: Encouraged to adjust caloric intake to maintain  or achieve ideal body weight, to reduce intake of dietary saturated fat and total fat, to limit sodium intake by avoiding high sodium foods and not adding table salt, and to maintain adequate dietary potassium and calcium preferably from fresh fruits, vegetables, and low-fat dairy products.    stressed the importance of regular exercise  Injury prevention: Discussed safety belts, safety helmets, smoke detector, smoking near bedding or upholstery.   Dental health: Discussed importance of regular tooth brushing, flossing, and dental visits.   Follow up plan: NEXT PREVENTATIVE PHYSICAL DUE IN 1 YEAR. Return in about 1 year (around 11/22/2024) for physical.

## 2023-11-24 ENCOUNTER — Ambulatory Visit: Payer: Self-pay | Admitting: Family Medicine

## 2023-11-24 LAB — COMPREHENSIVE METABOLIC PANEL WITH GFR
ALT: 16 IU/L (ref 0–44)
AST: 17 IU/L (ref 0–40)
Albumin: 4.7 g/dL (ref 3.8–4.9)
Alkaline Phosphatase: 62 IU/L (ref 47–123)
BUN/Creatinine Ratio: 17 (ref 9–20)
BUN: 17 mg/dL (ref 6–24)
Bilirubin Total: 0.8 mg/dL (ref 0.0–1.2)
CO2: 18 mmol/L — ABNORMAL LOW (ref 20–29)
Calcium: 9.2 mg/dL (ref 8.7–10.2)
Chloride: 106 mmol/L (ref 96–106)
Creatinine, Ser: 0.99 mg/dL (ref 0.76–1.27)
Globulin, Total: 2.6 g/dL (ref 1.5–4.5)
Glucose: 137 mg/dL — ABNORMAL HIGH (ref 70–99)
Potassium: 4.1 mmol/L (ref 3.5–5.2)
Sodium: 140 mmol/L (ref 134–144)
Total Protein: 7.3 g/dL (ref 6.0–8.5)
eGFR: 91 mL/min/1.73 (ref >59)

## 2023-11-24 LAB — CBC WITH DIFFERENTIAL/PLATELET
Basophils Absolute: 0 x10E3/uL (ref 0.0–0.2)
Basos: 0 %
EOS (ABSOLUTE): 0.1 x10E3/uL (ref 0.0–0.4)
Eos: 1 %
Hematocrit: 43.9 % (ref 37.5–51.0)
Hemoglobin: 14.7 g/dL (ref 13.0–17.7)
Immature Grans (Abs): 0 x10E3/uL (ref 0.0–0.1)
Immature Granulocytes: 0 %
Lymphocytes Absolute: 1.9 x10E3/uL (ref 0.7–3.1)
Lymphs: 21 %
MCH: 31.1 pg (ref 26.6–33.0)
MCHC: 33.5 g/dL (ref 31.5–35.7)
MCV: 93 fL (ref 79–97)
Monocytes Absolute: 0.7 x10E3/uL (ref 0.1–0.9)
Monocytes: 8 %
Neutrophils Absolute: 6.5 x10E3/uL (ref 1.4–7.0)
Neutrophils: 70 %
Platelets: 207 x10E3/uL (ref 150–450)
RBC: 4.73 x10E6/uL (ref 4.14–5.80)
RDW: 11.5 % — ABNORMAL LOW (ref 11.6–15.4)
WBC: 9.4 x10E3/uL (ref 3.4–10.8)

## 2023-11-24 LAB — LIPID PANEL W/O CHOL/HDL RATIO
Cholesterol, Total: 148 mg/dL (ref 100–199)
HDL: 30 mg/dL — ABNORMAL LOW (ref >39)
LDL Chol Calc (NIH): 99 mg/dL (ref 0–99)
Triglycerides: 103 mg/dL (ref 0–149)
VLDL Cholesterol Cal: 19 mg/dL (ref 5–40)

## 2023-11-24 LAB — PSA: Prostate Specific Ag, Serum: 1.2 ng/mL (ref 0.0–4.0)

## 2023-11-24 LAB — TSH: TSH: 1.5 u[IU]/mL (ref 0.450–4.500)

## 2023-11-24 LAB — HEPATITIS B SURFACE ANTIBODY, QUANTITATIVE: Hepatitis B Surf Ab Quant: <3.5 m[IU]/mL — ABNORMAL LOW

## 2024-11-24 ENCOUNTER — Encounter: Admitting: Family Medicine
